# Patient Record
Sex: Female | Born: 1990 | Race: White | Hispanic: No | Marital: Single | Smoking: Never smoker
Health system: Southern US, Community
[De-identification: ages and names within clinical notes are randomized; demographics above are authoritative.]

## PROBLEM LIST (undated history)

## (undated) DIAGNOSIS — G43909 Migraine, unspecified, not intractable, without status migrainosus: Secondary | ICD-10-CM

## (undated) DIAGNOSIS — G40909 Epilepsy, unspecified, not intractable, without status epilepticus: Secondary | ICD-10-CM

## (undated) DIAGNOSIS — J45909 Unspecified asthma, uncomplicated: Secondary | ICD-10-CM

## (undated) DIAGNOSIS — F419 Anxiety disorder, unspecified: Secondary | ICD-10-CM

## (undated) DIAGNOSIS — F32A Depression, unspecified: Secondary | ICD-10-CM

## (undated) HISTORY — PX: COLONOSCOPY: SHX174

---

## 2019-05-27 ENCOUNTER — Other Ambulatory Visit: Payer: Self-pay | Admitting: Gastroenterology

## 2019-05-27 DIAGNOSIS — R1084 Generalized abdominal pain: Secondary | ICD-10-CM

## 2019-06-04 ENCOUNTER — Ambulatory Visit: Payer: Medicaid Other

## 2019-06-25 ENCOUNTER — Ambulatory Visit
Admission: RE | Admit: 2019-06-25 | Discharge: 2019-06-25 | Disposition: A | Discharge status: Home / Self Care | Payer: Medicaid Other | Source: Ambulatory Visit | Attending: Gastroenterology | Admitting: Gastroenterology

## 2019-06-25 ENCOUNTER — Other Ambulatory Visit: Payer: Self-pay

## 2019-06-25 DIAGNOSIS — R1084 Generalized abdominal pain: Secondary | ICD-10-CM | POA: Insufficient documentation

## 2021-08-20 ENCOUNTER — Ambulatory Visit
Admission: EM | Admit: 2021-08-20 | Discharge: 2021-08-20 | Disposition: A | Discharge status: Home / Self Care | Payer: Medicaid Other | Attending: Physician Assistant | Admitting: Physician Assistant

## 2021-08-20 ENCOUNTER — Ambulatory Visit (INDEPENDENT_AMBULATORY_CARE_PROVIDER_SITE_OTHER): Payer: Medicaid Other

## 2021-08-20 ENCOUNTER — Other Ambulatory Visit: Payer: Self-pay

## 2021-08-20 DIAGNOSIS — R059 Cough, unspecified: Secondary | ICD-10-CM | POA: Diagnosis not present

## 2021-08-20 DIAGNOSIS — R0781 Pleurodynia: Secondary | ICD-10-CM | POA: Diagnosis not present

## 2021-08-20 DIAGNOSIS — R051 Acute cough: Secondary | ICD-10-CM | POA: Insufficient documentation

## 2021-08-20 DIAGNOSIS — J0300 Acute streptococcal tonsillitis, unspecified: Secondary | ICD-10-CM | POA: Diagnosis not present

## 2021-08-20 LAB — GROUP A STREP BY PCR: Group A Strep by PCR: DETECTED — AB

## 2021-08-20 MED ORDER — PROMETHAZINE-DM 6.25-15 MG/5ML PO SYRP: 5.0000 mL | ORAL_SOLUTION | Freq: Four times a day (QID) | ORAL | 0 refills | Status: DC | PRN

## 2021-08-20 MED ORDER — CEPHALEXIN 500 MG PO CAPS: 500.0000 mg | ORAL_CAPSULE | Freq: Two times a day (BID) | ORAL | 0 refills | Status: AC

## 2021-08-20 MED ORDER — PREDNISONE 20 MG PO TABS: 40.0000 mg | ORAL_TABLET | Freq: Every day | ORAL | 0 refills | Status: AC

## 2021-08-20 NOTE — ED Provider Notes (Signed)
[facility]    CSN: [id number] Arrival date & time: 08/20/21  1020      History   Chief Complaint Chief Complaint  Patient presents with   Flank Pain   Sore Throat   Cough    HPI Kimberly Villanueva is a 31 y.o. female presenting for 1 month history of cough and congestion.  She says she was treated at the beginning of February with cefdinir and symptoms improved but did not totally resolve.  Patient says over the past 3 days or so her cough is gotten worse and she has noticed that her tonsils are enlarged, throat is sore and she has exudates on her tonsils with bleeding tonsils at times.  Also reports increased pain under her ribs when she coughs.  States she was given naproxen which initially helped the pain in the left side of her ribs but has not helped with a new pain on the right side of her ribs.  She request to be tested for strep since she hears is going around.  She has taken OTC meds without improvement in symptoms.  Denies any breathing difficulty.  No other concerns.  HPI  History reviewed. No pertinent past medical history.  There are no problems to display for this patient.   History reviewed. No pertinent surgical history.  OB History   No obstetric history on file.      Home Medications    Prior to Admission medications   Medication Sig Start Date End Date Taking? Authorizing Provider  cephALEXin (KEFLEX) 500 MG capsule Take 1 capsule (500 mg total) by mouth 2 (two) times daily for 10 days. 08/20/21 08/30/21 Yes Shirlee Latch, PA-C  drospirenone-ethinyl estradiol (YAZ) 3-0.02 MG tablet Take 1 tablet by mouth daily. 10/27/20  Yes [provider]  fexofenadine (ALLEGRA) 180 MG tablet Take by mouth.   Yes [provider]  fluticasone (FLONASE) 50 MCG/ACT nasal spray Place into the nose.   Yes [provider]  LORazepam (ATIVAN) 0.5 MG tablet  08/12/10  Yes [provider]  montelukast (SINGULAIR) 10 MG tablet Take by  mouth. 08/17/17  Yes [provider]  naproxen (NAPROSYN) 500 MG tablet Take 500 mg by mouth 2 (two) times daily. 08/11/21  Yes [provider]  predniSONE (DELTASONE) 20 MG tablet Take 2 tablets (40 mg total) by mouth daily for 5 days. 08/20/21 08/25/21 Yes Shirlee Latch, PA-C  promethazine-dextromethorphan (PROMETHAZINE-DM) 6.25-15 MG/5ML syrup Take 5 mLs by mouth 4 (four) times daily as needed for cough. 08/20/21  Yes Eusebio Friendly B, PA-C  sertraline (ZOLOFT) 100 MG tablet Take by mouth.   Yes [provider]  topiramate (TOPAMAX) 200 MG tablet Take 1 tablet by mouth 2 (two) times daily. 08/23/17 09/14/21 Yes [provider]  zolmitriptan (ZOMIG-ZMT) 5 MG disintegrating tablet Take 5 mg by mouth as needed. 05/07/21  Yes [provider]    Family History History reviewed. No pertinent family history.  Social History Social History   Tobacco Use   Smoking status: Never   Smokeless tobacco: Never  Vaping Use   Vaping Use: Never used  Substance Use Topics   Alcohol use: Never   Drug use: Never     Allergies   Lamotrigine, Levetiracetam, Topiramate, Amoxicillin, and Codeine   Review of Systems Review of Systems  Constitutional:  Positive for appetite change and fatigue. Negative for chills, diaphoresis and fever.  HENT:  Positive for congestion, rhinorrhea and sore throat. Negative for ear  pain, sinus pressure and sinus pain.   Respiratory:  Positive for cough. Negative for shortness of breath.   Cardiovascular:  Positive for chest pain.  Gastrointestinal:  Negative for abdominal pain, nausea and vomiting.  Musculoskeletal:  Positive for myalgias. Negative for arthralgias.  Skin:  Negative for rash.  Neurological:  Positive for headaches. Negative for weakness.  Hematological:  Negative for adenopathy.    Physical Exam Triage Vital Signs ED Triage Vitals  Enc Vitals Group     BP 08/20/21 1045 101/66     Pulse Rate 08/20/21 1045 96      Resp 08/20/21 1045 18     Temp 08/20/21 1045 99.1 F (37.3 C)     Temp Source 08/20/21 1045 Oral     SpO2 08/20/21 1045 99 %     Weight 08/20/21 1042 160 lb (72.6 kg)     Height 08/20/21 1042 5\' 4"  (1.626 m)     Head Circumference --      Peak Flow --      Pain Score 08/20/21 1042 5     Pain Loc --      Pain Edu? --      Excl. in GC? --    No data found.  Updated Vital Signs BP 101/66 (BP Location: Left Arm)    Pulse 96    Temp 99.1 F (37.3 C) (Oral)    Resp 18    Ht 5\' 4"  (1.626 m)    Wt 160 lb (72.6 kg)    LMP 07/25/2021    SpO2 99%    BMI 27.46 kg/m      Physical Exam Vitals and nursing note reviewed.  Constitutional:      General: She is not in acute distress.    Appearance: Normal appearance. She is not ill-appearing or toxic-appearing.  HENT:     Head: Normocephalic and atraumatic.     Nose: Congestion present.     Mouth/Throat:     Mouth: Mucous membranes are moist.     Pharynx: Oropharynx is clear. Posterior oropharyngeal erythema present.     Tonsils: Tonsillar exudate (thick white exudates bilateral tonsils) present. 1+ on the right. 1+ on the left.  Eyes:     General: No scleral icterus.       Right eye: No discharge.        Left eye: No discharge.     Conjunctiva/sclera: Conjunctivae normal.  Cardiovascular:     Rate and Rhythm: Normal rate and regular rhythm.     Heart sounds: Normal heart sounds.  Pulmonary:     Effort: Pulmonary effort is normal. No respiratory distress.     Breath sounds: Normal breath sounds.  Musculoskeletal:     Cervical back: Neck supple.  Lymphadenopathy:     Cervical: Cervical adenopathy present.  Skin:    General: Skin is dry.  Neurological:     General: No focal deficit present.     Mental Status: She is alert. Mental status is at baseline.     Motor: No weakness.     Gait: Gait normal.  Psychiatric:        Mood and Affect: Mood normal.        Behavior: Behavior normal.        Thought Content: Thought content normal.      UC Treatments / Results  Labs (all labs ordered are listed, but only abnormal results are displayed) Labs Reviewed  GROUP A STREP BY PCR - Abnormal; Notable for the following  components:      Result Value   Group A Strep by PCR DETECTED (*)    All other components within normal limits    EKG   Radiology DG Chest 2 View  Result Date: 08/20/2021 CLINICAL DATA:  cough x 1 month EXAM: CHEST - 2 VIEW COMPARISON:  None. FINDINGS: The cardiomediastinal silhouette is normal in contour. No pleural effusion. No pneumothorax. No acute pleuroparenchymal abnormality. Visualized abdomen is unremarkable. No acute osseous abnormality noted. IMPRESSION: No acute cardiopulmonary abnormality. Electronically Signed   By: Meda Klinefelter M.D.   On: 08/20/2021 11:09    Procedures Procedures (including critical care time)  Medications Ordered in UC Medications - No data to display  Initial Impression / Assessment and Plan / UC Course  I have reviewed the triage vital signs and the nursing notes.  Pertinent labs & imaging results that were available during my care of the patient were reviewed by me and considered in my medical decision making (see chart for details).  31 year old female presenting for 1 month history of cough and congestion with worsening symptoms over the past couple of days and new onset severe sore throat with tonsillar exudates.  Vitals are normal and stable.  Patient afebrile.  She is ill-appearing but nontoxic.  On exam she has 1+ bilateral enlarged tonsils with thick whitish exudates.  Nasal congestion.  Chest clear auscultation.  PCR strep is positive.  Chest x-ray ordered given her persistent symptoms and complaint of right-sided rib pain.  X-ray is normal.  Discussed with patient.  Treating patient with Keflex.  Has allergy to penicillin/amoxicillin but cannot take cephalosporins.  Also sent Promethazine DM to pharmacy.  Prednisone to help with tonsillar swelling as  well as hopefully the inflammation of her ribs likely due to coughing/possibly costochondritis.  Encouraged rest and fluids.  Reviewed return and ER precautions.   Final Clinical Impressions(s) / UC Diagnoses   Final diagnoses:  Strep tonsillitis  Acute cough  Rib pain     Discharge Instructions      -You have strep.  Have sent antibiotics to the pharmacy. - Your chest x-ray was normal.  No evidence of pneumonia.  I think your pain is due to the cough.  I sent cough medication to the pharmacy as well as prednisone which is a stronger anti-inflammatory medication. - Increase rest and fluids. - You should be feeling better over the next several days.   ED Prescriptions     Medication Sig Dispense Auth. Provider   cephALEXin (KEFLEX) 500 MG capsule Take 1 capsule (500 mg total) by mouth 2 (two) times daily for 10 days. 20 capsule Eusebio Friendly B, PA-C   predniSONE (DELTASONE) 20 MG tablet Take 2 tablets (40 mg total) by mouth daily for 5 days. 10 tablet Shirlee Latch, PA-C   promethazine-dextromethorphan (PROMETHAZINE-DM) 6.25-15 MG/5ML syrup Take 5 mLs by mouth 4 (four) times daily as needed for cough. 118 mL Shirlee Latch, PA-C      PDMP not reviewed this encounter.   Shirlee Latch, PA-C 08/20/21 1145

## 2021-08-20 NOTE — ED Triage Notes (Signed)
Pt states that she has been sick for 1 month with sinusitis.  ? ?Pt c/o Sore throat, bleeding in her throat, cough, x3day ? ?Pt states that her sides have been hurting under her ribs. Pt states that she was given naproxen for left side pain but it is not helping the right side pain.  ? ?Pt wants to be tested for strep throat.  ?

## 2021-08-20 NOTE — Discharge Instructions (Addendum)
-  You have strep.  Have sent antibiotics to the pharmacy. ?- Your chest x-ray was normal.  No evidence of pneumonia.  I think your pain is due to the cough.  I sent cough medication to the pharmacy as well as prednisone which is a stronger anti-inflammatory medication. ?- Increase rest and fluids. ?- You should be feeling better over the next several days. ?

## 2021-10-30 IMAGING — US US ABDOMEN LIMITED
1 series · 14 of 25 positions shown · non-contrast
Comparison: None.

CLINICAL DATA: 28-year-old female with generalized abdominal pain
intermittently for 6-7 months.

EXAM:
ULTRASOUND ABDOMEN LIMITED RIGHT UPPER QUADRANT

[Series 1: us abdomen limited · 0.23mm/px · 14 of 66 slices shown]
[im 1/66]
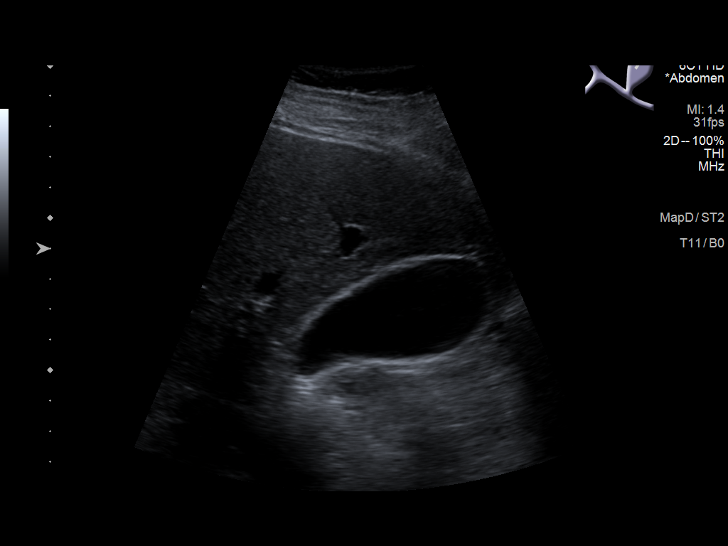
[im 6/66]
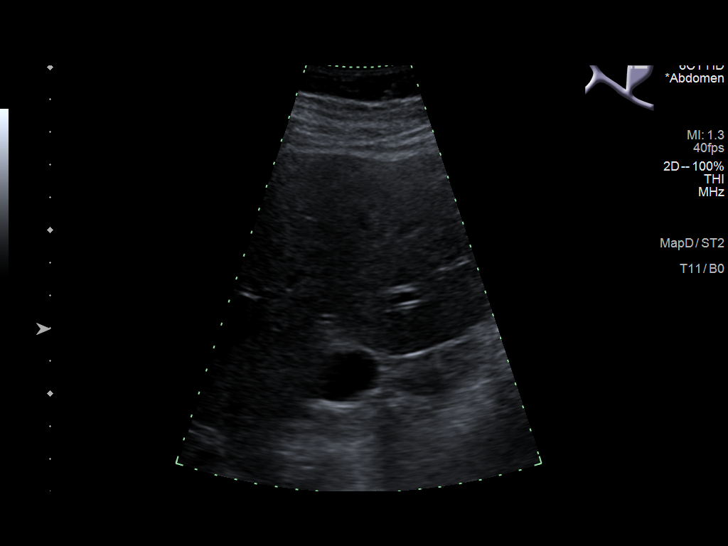
[im 11/66]
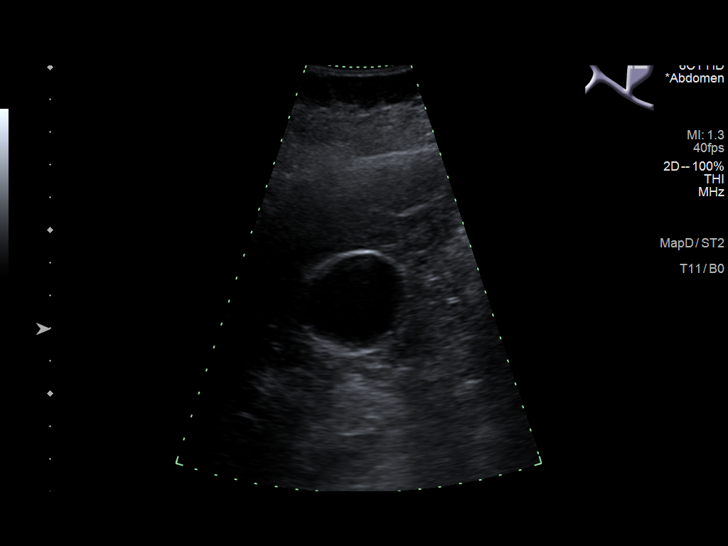
[im 17/66]
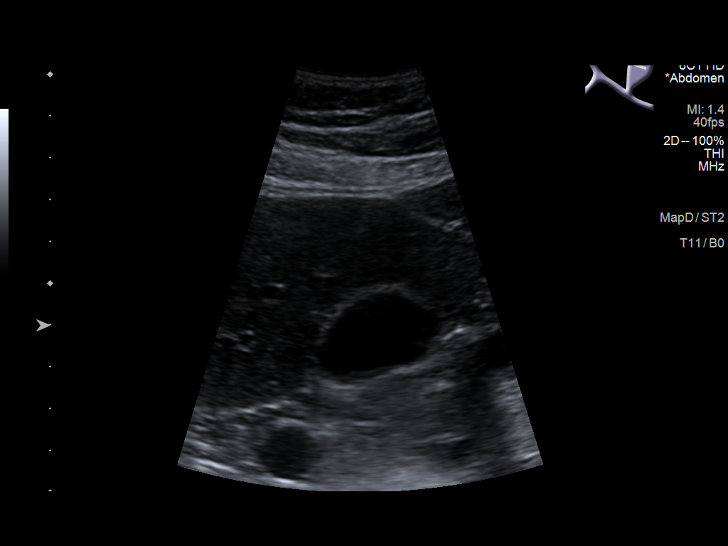
[im 22/66]
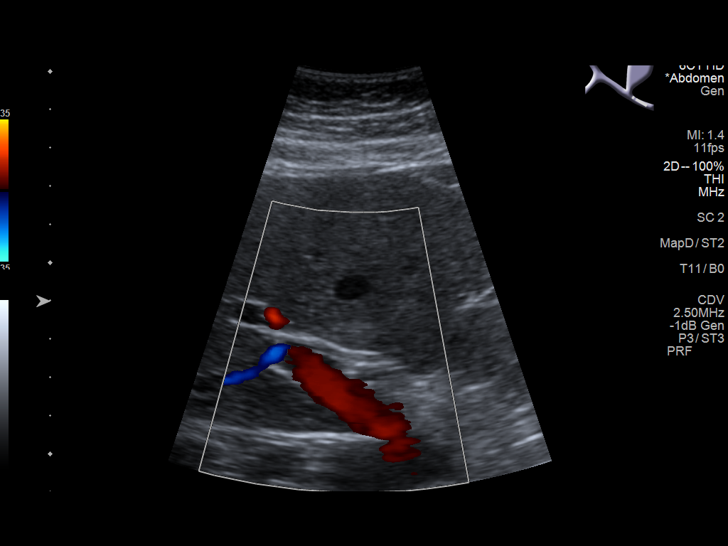
[im 25/66]
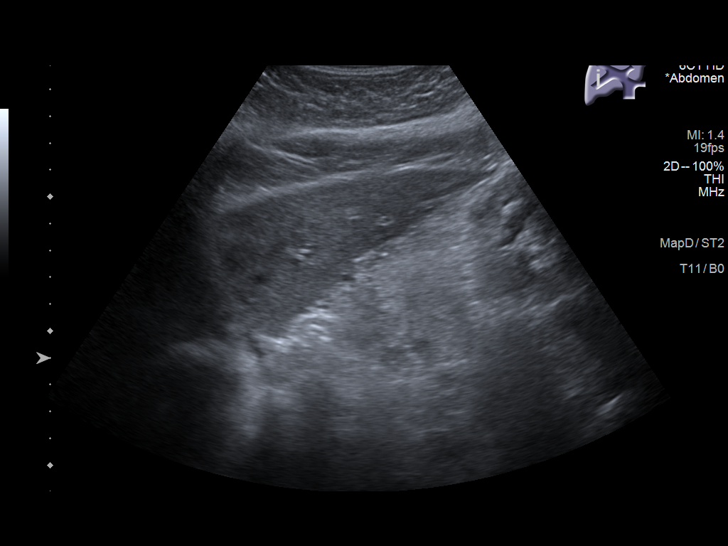
[im 30/66]
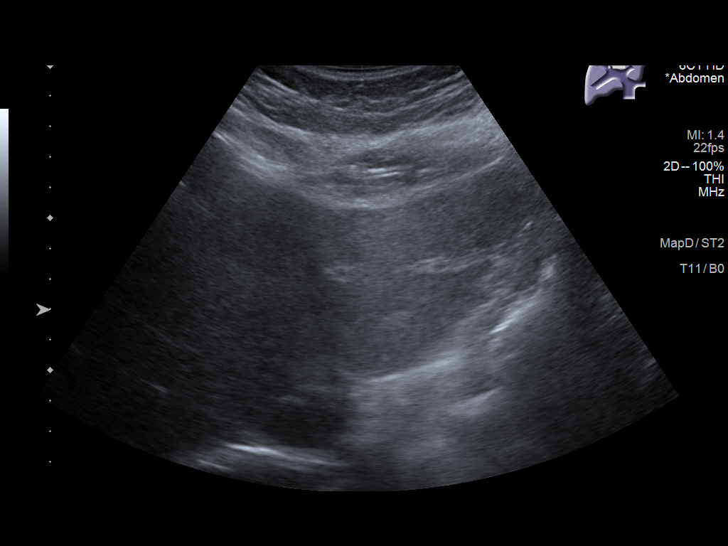
[im 36/66]
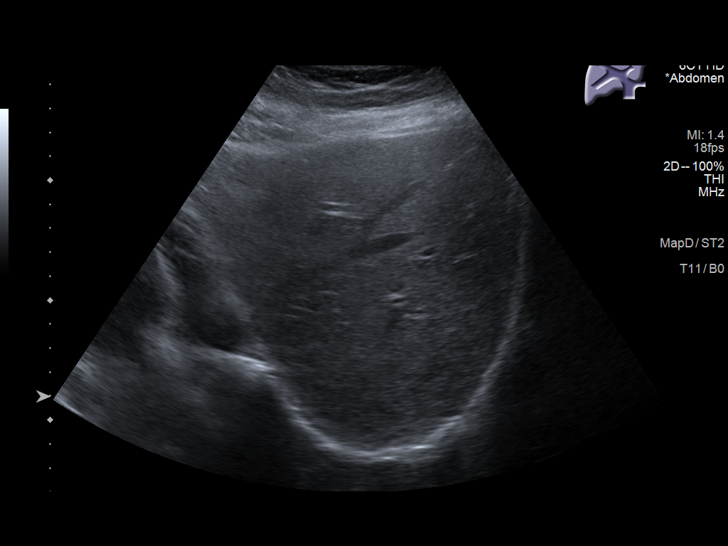
[im 41/66]
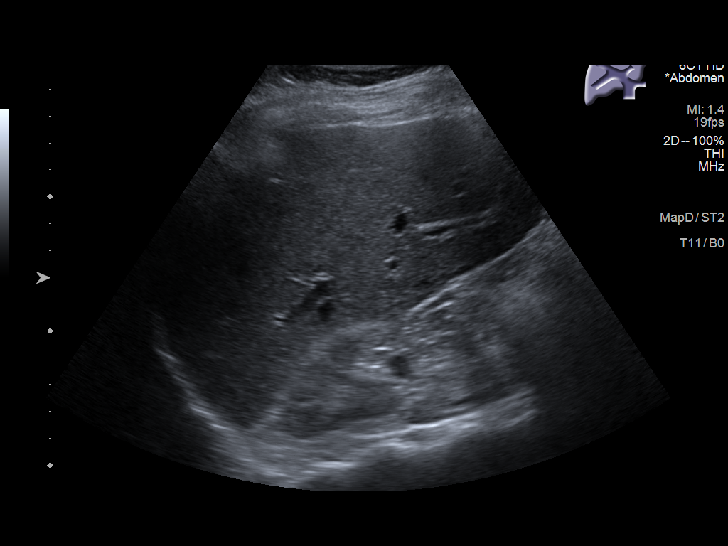
[im 44/66]
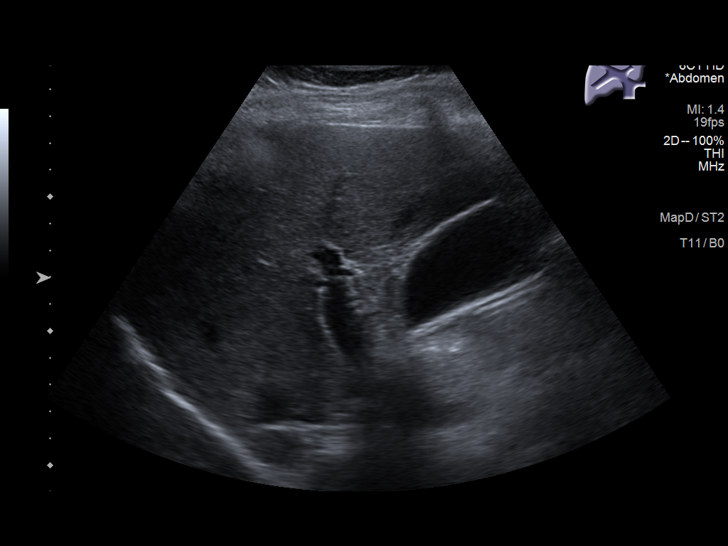
[im 49/66]
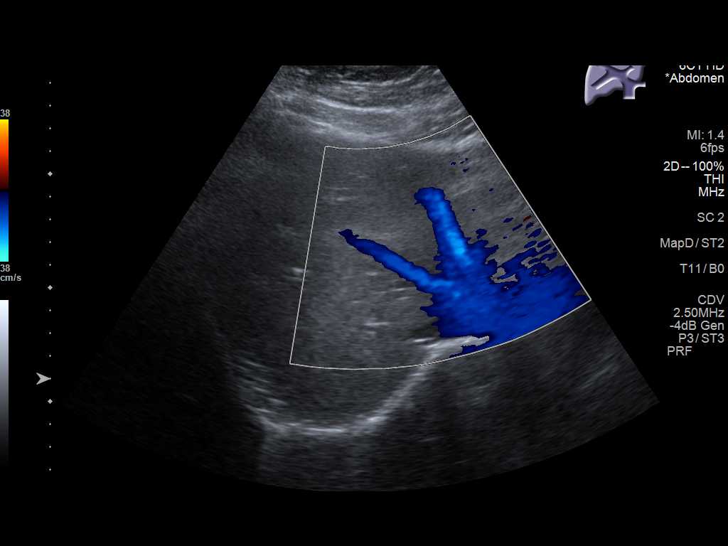
[im 55/66]
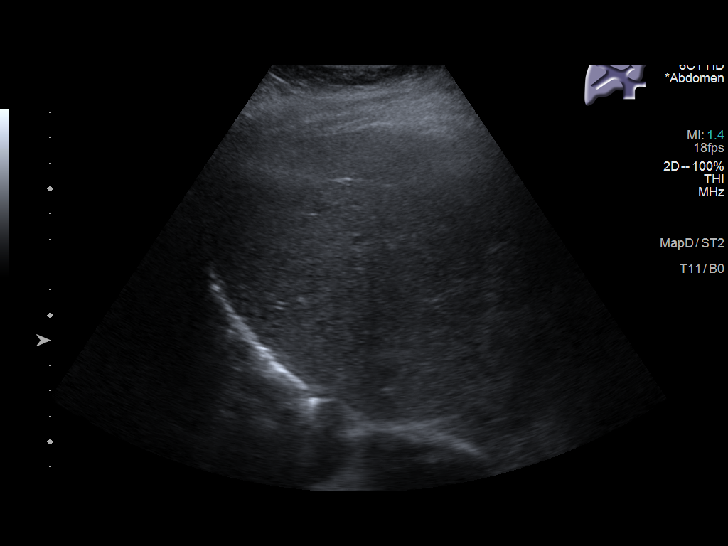
[im 60/66]
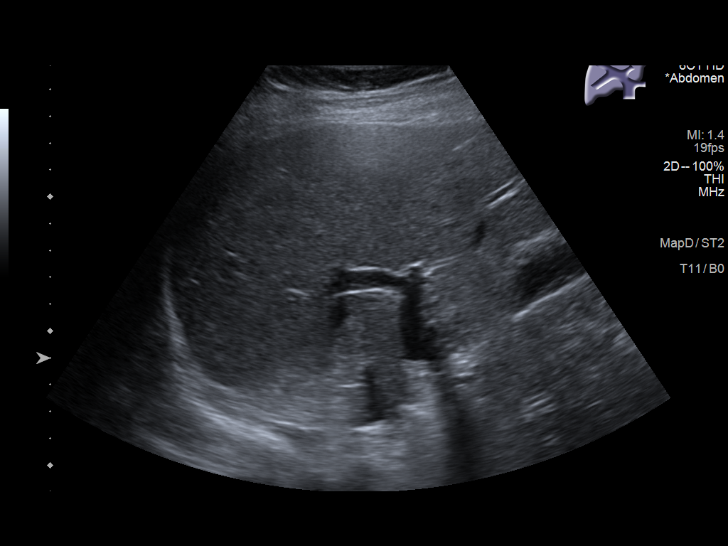
[im 66/66]
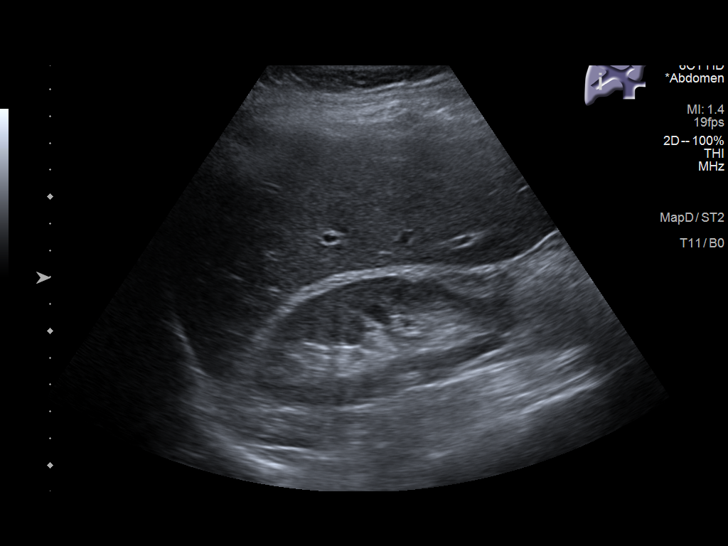

[14 of 25 positions shown; findings below may reference images not displayed]

FINDINGS: Gallbladder:

No gallstones or wall thickening visualized. No sonographic Murphy
sign noted by sonographer.

Common bile duct:

Diameter: 2 millimeters, normal.

Liver:

No focal lesion identified. Within normal limits in parenchymal
echogenicity. Portal vein is patent on color Doppler imaging with
normal direction of blood flow towards the liver.

Other: Negative visible right kidney.
IMPRESSION: Normal right upper quadrant ultrasound.

## 2023-03-11 DIAGNOSIS — N200 Calculus of kidney: Secondary | ICD-10-CM | POA: Diagnosis not present

## 2023-03-11 DIAGNOSIS — R109 Unspecified abdominal pain: Secondary | ICD-10-CM | POA: Diagnosis present

## 2023-03-11 DIAGNOSIS — E876 Hypokalemia: Secondary | ICD-10-CM | POA: Insufficient documentation

## 2023-03-11 NOTE — ED Triage Notes (Addendum)
Pt arrives via POV with CC of sharp L flank pain and spotting. Pt has had intermittent moderate pain for the last few days - sudden sharp pain today. Dx with cysts on ovaries and cervix via ultrasound approx 12 days ago (02/28/2023). Pt states told to come into ER if pain worsened. Pt appears to be in NAD at this time.

## 2023-03-12 ENCOUNTER — Emergency Department
Admission: EM | Admit: 2023-03-12 | Discharge: 2023-03-12 | Disposition: A | Discharge status: Home / Self Care | Payer: Medicaid Other | Attending: Emergency Medicine | Admitting: Emergency Medicine

## 2023-03-12 ENCOUNTER — Other Ambulatory Visit: Payer: Self-pay

## 2023-03-12 ENCOUNTER — Emergency Department: Payer: Medicaid Other

## 2023-03-12 DIAGNOSIS — N200 Calculus of kidney: Secondary | ICD-10-CM

## 2023-03-12 DIAGNOSIS — E876 Hypokalemia: Secondary | ICD-10-CM

## 2023-03-12 DIAGNOSIS — R109 Unspecified abdominal pain: Secondary | ICD-10-CM

## 2023-03-12 HISTORY — DX: Migraine, unspecified, not intractable, without status migrainosus: G43.909

## 2023-03-12 HISTORY — DX: Depression, unspecified: F32.A

## 2023-03-12 HISTORY — DX: Epilepsy, unspecified, not intractable, without status epilepticus: G40.909

## 2023-03-12 HISTORY — DX: Anxiety disorder, unspecified: F41.9

## 2023-03-12 LAB — URINALYSIS, ROUTINE W REFLEX MICROSCOPIC
Bilirubin Urine: NEGATIVE
Glucose, UA: NEGATIVE mg/dL
Hgb urine dipstick: NEGATIVE
Ketones, ur: NEGATIVE mg/dL
Leukocytes,Ua: NEGATIVE
Nitrite: NEGATIVE
Protein, ur: NEGATIVE mg/dL
Specific Gravity, Urine: 1.013 (ref 1.005–1.030)
pH: 6 (ref 5.0–8.0)

## 2023-03-12 LAB — CBC WITH DIFFERENTIAL/PLATELET
Abs Immature Granulocytes: 0.05 10*3/uL (ref 0.00–0.07)
Basophils Absolute: 0.1 10*3/uL (ref 0.0–0.1)
Basophils Relative: 1 %
Eosinophils Absolute: 0.6 10*3/uL — ABNORMAL HIGH (ref 0.0–0.5)
Eosinophils Relative: 5 %
HCT: 42.7 % (ref 36.0–46.0)
Hemoglobin: 14.7 g/dL (ref 12.0–15.0)
Immature Granulocytes: 0 %
Lymphocytes Relative: 36 %
Lymphs Abs: 4.8 10*3/uL — ABNORMAL HIGH (ref 0.7–4.0)
MCH: 31.3 pg (ref 26.0–34.0)
MCHC: 34.4 g/dL (ref 30.0–36.0)
MCV: 90.9 fL (ref 80.0–100.0)
Monocytes Absolute: 1.1 10*3/uL — ABNORMAL HIGH (ref 0.1–1.0)
Monocytes Relative: 8 %
Neutro Abs: 6.6 10*3/uL (ref 1.7–7.7)
Neutrophils Relative %: 50 %
Platelets: 327 10*3/uL (ref 150–400)
RBC: 4.7 MIL/uL (ref 3.87–5.11)
RDW: 11.9 % (ref 11.5–15.5)
WBC: 13.3 10*3/uL — ABNORMAL HIGH (ref 4.0–10.5)
nRBC: 0 % (ref 0.0–0.2)

## 2023-03-12 LAB — COMPREHENSIVE METABOLIC PANEL
ALT: 30 U/L (ref 0–44)
AST: 22 U/L (ref 15–41)
Albumin: 3.7 g/dL (ref 3.5–5.0)
Alkaline Phosphatase: 60 U/L (ref 38–126)
Anion gap: 11 (ref 5–15)
BUN: 17 mg/dL (ref 6–20)
CO2: 22 mmol/L (ref 22–32)
Calcium: 9 mg/dL (ref 8.9–10.3)
Chloride: 105 mmol/L (ref 98–111)
Creatinine, Ser: 0.83 mg/dL (ref 0.44–1.00)
GFR, Estimated: >60 mL/min (ref >60)
Glucose, Bld: 103 mg/dL — ABNORMAL HIGH (ref 70–99)
Potassium: 3 mmol/L — ABNORMAL LOW (ref 3.5–5.1)
Sodium: 138 mmol/L (ref 135–145)
Total Bilirubin: 0.5 mg/dL (ref 0.3–1.2)
Total Protein: 7 g/dL (ref 6.5–8.1)

## 2023-03-12 LAB — POC URINE PREG, ED: Preg Test, Ur: NEGATIVE

## 2023-03-12 MED ORDER — ONDANSETRON 4 MG PO TBDP: 4.0000 mg | ORAL_TABLET | Freq: Three times a day (TID) | ORAL | 0 refills | Status: DC | PRN

## 2023-03-12 MED ORDER — SODIUM CHLORIDE 0.9 % IV BOLUS
1000.0000 mL | Freq: Once | INTRAVENOUS | Status: AC
  Administered 2023-03-12: 1000 mL via INTRAVENOUS

## 2023-03-12 MED ORDER — KETOROLAC TROMETHAMINE 30 MG/ML IJ SOLN
15.0000 mg | Freq: Once | INTRAMUSCULAR | Status: AC
  Administered 2023-03-12: 15 mg via INTRAVENOUS
  Filled 2023-03-12: qty 1

## 2023-03-12 MED ORDER — HYDROCODONE-ACETAMINOPHEN 5-325 MG PO TABS: 1.0000 | ORAL_TABLET | Freq: Four times a day (QID) | ORAL | 0 refills | Status: DC | PRN

## 2023-03-12 MED ORDER — ONDANSETRON HCL 4 MG/2ML IJ SOLN
4.0000 mg | Freq: Once | INTRAMUSCULAR | Status: AC
  Administered 2023-03-12: 4 mg via INTRAVENOUS
  Filled 2023-03-12: qty 2

## 2023-03-12 NOTE — Discharge Instructions (Signed)
You may take Ibuprofen as needed for pain, Norco as needed for more severe pain.  You may take Zofran as needed for nausea or vomiting.  Drink plenty of bottled or filtered water daily.  Return to the ER for worsening symptoms, persistent vomiting, difficulty breathing or other concerns.

## 2023-03-12 NOTE — ED Provider Notes (Signed)
[facility] Provider Note    Event Date/Time   First MD Initiated Contact with Patient 03/12/23 [phone]     (approximate)   History   Flank Pain   HPI  Kimberly Villanueva is a 32 y.o. female who presents to the ED from home with a chief complaint of left flank pain x 1 week, intermittent.  Sudden sharp increase in pain around 10 PM last night associated with nausea.  Almost 2 weeks ago she was diagnosed with ovarian cysts via ultrasound by her GYN.  Denies associated fever/chills, chest pain, shortness of breath, vomiting or hematuria.     Past Medical History   Past Medical History:  Diagnosis Date   Anxiety    Depression    Epilepsy (HCC)    Migraine      Active Problem List  There are no problems to display for this patient.    Past Surgical History  History reviewed. No pertinent surgical history.   Home Medications   Prior to Admission medications   Medication Sig Start Date End Date Taking? Authorizing Provider  HYDROcodone-acetaminophen (NORCO) 5-325 MG tablet Take 1 tablet by mouth every 6 (six) hours as needed for moderate pain. 03/12/23  Yes Irean Hong, MD  ondansetron (ZOFRAN-ODT) 4 MG disintegrating tablet Take 1 tablet (4 mg total) by mouth every 8 (eight) hours as needed for nausea or vomiting. 03/12/23  Yes Irean Hong, MD  drospirenone-ethinyl estradiol (YAZ) 3-0.02 MG tablet Take 1 tablet by mouth daily. 10/27/20   [provider]  fexofenadine (ALLEGRA) 180 MG tablet Take by mouth.    [provider]  fluticasone (FLONASE) 50 MCG/ACT nasal spray Place into the nose.    [provider]  LORazepam (ATIVAN) 0.5 MG tablet  08/12/10   [provider]  montelukast (SINGULAIR) 10 MG tablet Take by mouth. 08/17/17   [provider]  naproxen (NAPROSYN) 500 MG tablet Take 500 mg by mouth 2 (two) times daily. 08/11/21   [provider]  promethazine-dextromethorphan (PROMETHAZINE-DM)  6.25-15 MG/5ML syrup Take 5 mLs by mouth 4 (four) times daily as needed for cough. 08/20/21   Eusebio Friendly B, PA-C  sertraline (ZOLOFT) 100 MG tablet Take by mouth.    [provider]  topiramate (TOPAMAX) 200 MG tablet Take 1 tablet by mouth 2 (two) times daily. 08/23/17 09/14/21  [provider]  zolmitriptan (ZOMIG-ZMT) 5 MG disintegrating tablet Take 5 mg by mouth as needed. 05/07/21   [provider]     Allergies  Lamotrigine, Levetiracetam, Topiramate, Amoxicillin, and Codeine   Family History  History reviewed. No pertinent family history.   Physical Exam  Triage Vital Signs: ED Triage Vitals  Encounter Vitals Group     BP 03/12/23 0001 106/81     Systolic BP Percentile --      Diastolic BP Percentile --      Pulse Rate 03/12/23 0001 78     Resp 03/12/23 0001 15     Temp 03/12/23 0001 98 F (36.7 C)     Temp Source 03/12/23 0001 Oral     SpO2 03/12/23 0001 98 %     Weight 03/12/23 0002 155 lb (70.3 kg)     Height 03/12/23 0002 5\' 5"  (1.651 m)     Head Circumference --      Peak Flow --      Pain Score 03/12/23 0001 7     Pain Loc --  Pain Education --      Exclude from Growth Chart --     Updated Vital Signs: BP 94/66   Pulse 66   Temp 98 F (36.7 C) (Oral)   Resp 14   Ht 5\' 5"  (1.651 m)   Wt 70.3 kg   SpO2 100%   BMI 25.79 kg/m    General: Awake, no distress.  CV:  RRR.  Good peripheral perfusion.  Resp:  Normal effort.  CTAB. Abd:  Slight left lower quadrant and flank tenderness to palpation without rebound or guarding.  No distention.  Other:  No truncal vesicles.   ED Results / Procedures / Treatments  Labs (all labs ordered are listed, but only abnormal results are displayed) Labs Reviewed  CBC WITH DIFFERENTIAL/PLATELET - Abnormal; Notable for the following components:      Result Value   WBC 13.3 (*)    Lymphs Abs 4.8 (*)    Monocytes Absolute 1.1 (*)    Eosinophils Absolute 0.6 (*)    All other components  within normal limits  COMPREHENSIVE METABOLIC PANEL - Abnormal; Notable for the following components:   Potassium 3.0 (*)    Glucose, Bld 103 (*)    All other components within normal limits  URINALYSIS, ROUTINE W REFLEX MICROSCOPIC - Abnormal; Notable for the following components:   Color, Urine YELLOW (*)    APPearance CLOUDY (*)    All other components within normal limits  POC URINE PREG, ED     EKG  None   RADIOLOGY I have independently visualized and interpreted patient's imaging studies as well as noted the radiology interpretation:  Pelvic ultrasound: Unremarkable  CT renal stone study: Renal stones bilaterally  Official radiology report(s): CT Renal Stone Study  Result Date: 03/12/2023 CLINICAL DATA:  Abdominal/flank pain with stone suspected. Sharp left flank pain with spotting EXAM: CT ABDOMEN AND PELVIS WITHOUT CONTRAST TECHNIQUE: Multidetector CT imaging of the abdomen and pelvis was performed following the standard protocol without IV contrast. RADIATION DOSE REDUCTION: This exam was performed according to the departmental dose-optimization program which includes automated exposure control, adjustment of the mA and/or kV according to patient size and/or use of iterative reconstruction technique. COMPARISON:  None Available. FINDINGS: Lower chest:  No contributory findings. Hepatobiliary: No focal liver abnormality.No evidence of biliary obstruction or stone. Pancreas: Unremarkable. Spleen: Unremarkable. Adrenals/Urinary Tract: Negative adrenals. No hydronephrosis or ureteral stone. Four left renal calculi measuring up to 5 mm at the lower pole. Punctate right lower pole calculus. Unremarkable bladder. Stomach/Bowel:  No obstruction. No appendicitis. Vascular/Lymphatic: No acute vascular abnormality. No mass or adenopathy. Reproductive:No pathologic findings. Other: No ascites or pneumoperitoneum. Musculoskeletal: No acute abnormalities. IMPRESSION: 1. No acute finding.  No  hydronephrosis or ureteral calculus. 2. Left more than right nephrolithiasis. Electronically Signed   By: Tiburcio Pea M.D.   On: 03/12/2023 05:00   US PELVIC COMPLETE W TRANSVAGINAL AND TORSION R/O  Result Date: 03/12/2023 CLINICAL DATA:  Pelvic pain. EXAM: TRANSABDOMINAL AND TRANSVAGINAL ULTRASOUND OF PELVIS DOPPLER ULTRASOUND OF OVARIES TECHNIQUE: Both transabdominal and transvaginal ultrasound examinations of the pelvis were performed. Transabdominal technique was performed for global imaging of the pelvis including uterus, ovaries, adnexal regions, and pelvic cul-de-sac. It was necessary to proceed with endovaginal exam following the transabdominal exam to visualize the uterus, endometrium, bilateral ovaries and bilateral adnexa. Color and duplex Doppler ultrasound was utilized to evaluate blood flow to the ovaries. COMPARISON:  None Available. FINDINGS: Uterus Measurements: 7.0 cm x 3.8 cm x 3.9  cm = volume: 54.0 mL. No fibroids or other mass visualized. Endometrium Thickness: 4.0 mm.  No focal abnormality visualized. Right ovary Measurements: 3.0 cm x 1.3 cm x 1.7 cm = volume: 3.6 mL. Normal appearance/no adnexal mass. Left ovary Measurements: 3.0 cm x 1.8 cm x 2.1 cm = volume: 5.8 mL. Normal appearance/no adnexal mass. Pulsed Doppler evaluation of both ovaries demonstrates normal low-resistance arterial and venous waveforms. Other findings No abnormal free fluid. IMPRESSION: Unremarkable pelvic ultrasound. Electronically Signed   By: Aram Candela M.D.   On: 03/12/2023 01:09     PROCEDURES:  Critical Care performed: No  Procedures   MEDICATIONS ORDERED IN ED: Medications  sodium chloride 0.9 % bolus 1,000 mL (1,000 mLs Intravenous New Bag/Given 03/12/23 0501)  ondansetron (ZOFRAN) injection 4 mg (4 mg Intravenous Given 03/12/23 0502)  ketorolac (TORADOL) 30 MG/ML injection 15 mg (15 mg Intravenous Given 03/12/23 0502)     IMPRESSION / MDM / ASSESSMENT AND PLAN / ED COURSE  I  reviewed the triage vital signs and the nursing notes.                             32 year old female presenting with left flank pain. Differential diagnosis includes, but is not limited to, ovarian cyst, ovarian torsion, acute appendicitis, diverticulitis, urinary tract infection/pyelonephritis, endometriosis, bowel obstruction, colitis, renal colic, gastroenteritis, hernia, fibroids, endometriosis, pregnancy related pain including ectopic pregnancy, etc. I personally reviewed patient's records and note her GYN office visit on 02/28/2023 for ovarian cysts.  Patient's presentation is most consistent with acute complicated illness / injury requiring diagnostic workup.  The patient is on the cardiac monitor to evaluate for evidence of arrhythmia and/or significant heart rate changes.  Laboratory results demonstrate mild leukocytosis with WBC 13, mild hypokalemia potassium 3, cloudy appearance of urine without nitrites or leukocytes.  POCT negative.  Pelvic ultrasound unremarkable.  Will administer IV fluids, Ketorolac for pain, Zofran for nausea.  Will obtain CT renal colic study and reassess.  Clinical Course as of 03/12/23 4132  [facility] Mar 12, 2023  0554 Pain improved.  Updated patient and family member of CT results demonstrating renal stones.  Will discharge home with as needed pain and nausea medicine with urology follow-up.  Strict return precautions given.  Both verbalized understanding and agree with plan of care. [JS]    Clinical Course User Index [JS] Irean Hong, MD     FINAL CLINICAL IMPRESSION(S) / ED DIAGNOSES   Final diagnoses:  Left flank pain  Hypokalemia  Kidney stone     Rx / DC Orders   ED Discharge Orders          Ordered    HYDROcodone-acetaminophen (NORCO) 5-325 MG tablet  Every 6 hours PRN        03/12/23 0556    ondansetron (ZOFRAN-ODT) 4 MG disintegrating tablet  Every 8 hours PRN        03/12/23 0556             Note:  This document was prepared  using Dragon voice recognition software and may include unintentional dictation errors.   Irean Hong, MD 03/12/23 [phone]

## 2023-04-04 ENCOUNTER — Ambulatory Visit: Payer: Medicaid Other | Admitting: Urology

## 2023-04-04 ENCOUNTER — Encounter: Payer: Self-pay | Admitting: Urology

## 2023-04-04 VITALS — BP 105/70 | HR 92 | Ht 65.0 in | Wt 156.0 lb

## 2023-04-04 DIAGNOSIS — N200 Calculus of kidney: Secondary | ICD-10-CM | POA: Diagnosis not present

## 2023-04-04 DIAGNOSIS — R109 Unspecified abdominal pain: Secondary | ICD-10-CM

## 2023-04-04 NOTE — Patient Instructions (Signed)
For musculoskeletal low back pain, I would recommend the "[employer]ill Big 3" exercises, you can find more details online, but these exercises are the bird-dog, side plank, and modified curl up.  These can help within just a few days with low back pain if it is related to musculoskeletal issues.  Would recommend trying to find an alternative to Topamax, as this can contribute to kidney stone formation.  Acute Back Pain, Adult Acute back pain is sudden and usually short-lived. It is often caused by an injury to the muscles and tissues in the back. The injury may result from: A muscle, tendon, or ligament getting overstretched or torn. Ligaments are tissues that connect bones to each other. Lifting something improperly can cause a back strain. Wear and tear (degeneration) of the spinal disks. Spinal disks are circular tissue that provide cushioning between the bones of the spine (vertebrae). Twisting motions, such as while playing sports or doing yard work. A hit to the back. Arthritis. You may have a physical exam, lab tests, and imaging tests to find the cause of your pain. Acute back pain usually goes away with rest and home care. Follow these instructions at home: Managing pain, stiffness, and swelling Take over-the-counter and prescription medicines only as told by your health care provider. Treatment may include medicines for pain and inflammation that are taken by mouth or applied to the skin, or muscle relaxants. Your health care provider may recommend applying ice during the first 24-48 hours after your pain starts. To do this: Put ice in a plastic bag. Place a towel between your skin and the bag. Leave the ice on for 20 minutes, 2-3 times a day. Remove the ice if your skin turns bright red. This is very important. If you cannot feel pain, heat, or cold, you have a greater risk of damage to the area. If directed, apply heat to the affected area as often as told by your health care provider. Use  the heat source that your health care provider recommends, such as a moist heat pack or a heating pad. Place a towel between your skin and the heat source. Leave the heat on for 20-30 minutes. Remove the heat if your skin turns bright red. This is especially important if you are unable to feel pain, heat, or cold. You have a greater risk of getting burned. Activity  Do not stay in bed. Staying in bed for more than 1-2 days can delay your recovery. Sit up and stand up straight. Avoid leaning forward when you sit or hunching over when you stand. If you work at a desk, sit close to it so you do not need to lean over. Keep your chin tucked in. Keep your neck drawn back, and keep your elbows bent at a 90-degree angle (right angle). Sit high and close to the steering wheel when you drive. Add lower back (lumbar) support to your car seat, if needed. Take short walks on even surfaces as soon as you are able. Try to increase the length of time you walk each day. Do not sit, drive, or stand in one place for more than 30 minutes at a time. Sitting or standing for long periods of time can put stress on your back. Do not drive or use heavy machinery while taking prescription pain medicine. Use proper lifting techniques. When you bend and lift, use positions that put less stress on your back: Watson your knees. Keep the load close to your body. Avoid twisting. Exercise regularly  as told by your health care provider. Exercising helps your back heal faster and helps prevent back injuries by keeping muscles strong and flexible. Work with a physical therapist to make a safe exercise program, as recommended by your health care provider. Do any exercises as told by your physical therapist. Lifestyle Maintain a healthy weight. Extra weight puts stress on your back and makes it difficult to have good posture. Avoid activities or situations that make you feel anxious or stressed. Stress and anxiety increase muscle  tension and can make back pain worse. Learn ways to manage anxiety and stress, such as through exercise. General instructions Sleep on a firm mattress in a comfortable position. Try lying on your side with your knees slightly bent. If you lie on your back, put a pillow under your knees. Keep your head and neck in a straight line with your spine (neutral position) when using electronic equipment like smartphones or pads. To do this: Raise your smartphone or pad to look at it instead of bending your head or neck to look down. Put the smartphone or pad at the level of your face while looking at the screen. Follow your treatment plan as told by your health care provider. This may include: Cognitive or behavioral therapy. Acupuncture or massage therapy. Meditation or yoga. Contact a health care provider if: You have pain that is not relieved with rest or medicine. You have increasing pain going down into your legs or buttocks. Your pain does not improve after 2 weeks. You have pain at night. You lose weight without trying. You have a fever or chills. You develop nausea or vomiting. You develop abdominal pain. Get help right away if: You develop new bowel or bladder control problems. You have unusual weakness or numbness in your arms or legs. You feel faint. These symptoms may represent a serious problem that is an emergency. Do not wait to see if the symptoms will go away. Get medical help right away. Call your local emergency services (911 in the [place] Do not drive yourself to the hospital. Summary Acute back pain is sudden and usually short-lived. Use proper lifting techniques. When you bend and lift, use positions that put less stress on your back. Take over-the-counter and prescription medicines only as told by your health care provider, and apply heat or ice as told. This information is not intended to replace advice given to you by your health care provider. Make sure you discuss any  questions you have with your health care provider. Document Revised: 08/27/2020 Document Reviewed: 08/27/2020 Elsevier Patient Education  2024 [employer].

## 2023-04-04 NOTE — Progress Notes (Signed)
   04/04/23 11:45 AM   Kimberly Villanueva 04/21/1991 [id number]  CC: Flank pain, nephrolithiasis  HPI: 32 year old female with history of epilepsy on Topamax as well as anxiety who presented to the ER on 03/12/2023 with left-sided flank pain.  She previously was diagnosed with possible ovarian cyst on ultrasound by GYN.  CT at that time showed no hydronephrosis, nonobstructing left-sided renal stones including a 5 mm left lower pole stone.  Urinalysis was benign.  Since that time she has overall done well but had some intermittent flank pain, more so on the right side.  She denies any fevers, chills, or urinary symptoms.   PMH: Past Medical History:  Diagnosis Date   Anxiety    Depression    Epilepsy (HCC)    Migraine      Family History: History reviewed. No pertinent family history.  Social History:  reports that she has never smoked. She has never used smokeless tobacco. She reports that she does not drink alcohol and does not use drugs.  Physical Exam: BP 105/70 (BP Location: Left Arm, Patient Position: Sitting, Cuff Size: Normal)   Pulse 92   Ht 5\' 5"  (1.651 m)   Wt 156 lb (70.8 kg)   BMI 25.96 kg/m    Constitutional:  Alert and oriented, No acute distress. Cardiovascular: No clubbing, cyanosis, or edema. Respiratory: Normal respiratory effort, no increased work of breathing. GI: Abdomen is soft, nontender, nondistended, no abdominal masses   Laboratory Data: Reviewed, see HPI  Pertinent Imaging: I have personally viewed and interpreted the CT scan showing a 5 mm left lower pole stone, no evidence of ureteral stone or hydronephrosis.  Assessment & Plan:   32 year old female with nonobstructing nephrolithiasis seen on recent CT, largest stone is 5 mm in the left lower pole, her symptoms are primarily right-sided over the last week.  Urinalysis is benign.  We discussed symptoms are unlikely to be related to nephrolithiasis.  We reviewed possible etiologies including  intermittent obstruction from a stone, ovarian cyst, or musculoskeletal.  I also recommended discussing with her neurologist alternatives to Topamax that would be less likely to contribute to kidney stone formation.  We also discussed considering removing the left-sided stone with shockwave lithotripsy or ureteroscopy, but low suspicion that would improve her right-sided flank pain.  We also reviewed some low back strengthening exercises including the McGill big 3 in case this is more of a musculoskeletal issue.  We discussed general stone prevention strategies including adequate hydration with goal of producing 2.5 L of urine daily, increasing citric acid intake, increasing calcium intake during high oxalate meals, minimizing animal protein, and decreasing salt intake. Information about dietary recommendations given today.   RTC 3 months symptom check, KUB   Legrand Rams, MD 04/04/2023  [facility] [address] [place], [place] [address] [phone]

## 2023-07-10 ENCOUNTER — Telehealth: Payer: Self-pay

## 2023-07-10 ENCOUNTER — Ambulatory Visit: Payer: Medicaid Other | Admitting: Urology

## 2023-07-10 ENCOUNTER — Ambulatory Visit
Admission: RE | Admit: 2023-07-10 | Discharge: 2023-07-10 | Disposition: A | Discharge status: Home / Self Care | Payer: Medicaid Other | Attending: Urology | Admitting: Urology

## 2023-07-10 ENCOUNTER — Other Ambulatory Visit
Admission: RE | Admit: 2023-07-10 | Discharge: 2023-07-10 | Disposition: A | Discharge status: Home / Self Care | Payer: Medicaid Other | Source: Home / Self Care | Attending: Urology | Admitting: Urology

## 2023-07-10 ENCOUNTER — Encounter: Payer: Self-pay | Admitting: Urology

## 2023-07-10 ENCOUNTER — Ambulatory Visit
Admission: RE | Admit: 2023-07-10 | Discharge: 2023-07-10 | Disposition: A | Discharge status: Home / Self Care | Payer: Medicaid Other | Source: Ambulatory Visit | Attending: Urology | Admitting: Urology

## 2023-07-10 VITALS — BP 96/66 | HR 88 | Ht 65.0 in | Wt 160.0 lb

## 2023-07-10 DIAGNOSIS — N200 Calculus of kidney: Secondary | ICD-10-CM

## 2023-07-10 DIAGNOSIS — R109 Unspecified abdominal pain: Secondary | ICD-10-CM | POA: Diagnosis present

## 2023-07-10 DIAGNOSIS — N3000 Acute cystitis without hematuria: Secondary | ICD-10-CM

## 2023-07-10 DIAGNOSIS — R3989 Other symptoms and signs involving the genitourinary system: Secondary | ICD-10-CM

## 2023-07-10 DIAGNOSIS — B379 Candidiasis, unspecified: Secondary | ICD-10-CM

## 2023-07-10 LAB — URINALYSIS, COMPLETE (UACMP) WITH MICROSCOPIC
Bilirubin Urine: NEGATIVE
Glucose, UA: NEGATIVE mg/dL
Hgb urine dipstick: NEGATIVE
Ketones, ur: NEGATIVE mg/dL
Nitrite: POSITIVE — AB
Protein, ur: 30 mg/dL — AB
RBC / HPF: NONE SEEN RBC/hpf (ref 0–5)
Specific Gravity, Urine: 1.025 (ref 1.005–1.030)
pH: 6 (ref 5.0–8.0)

## 2023-07-10 MED ORDER — SULFAMETHOXAZOLE-TRIMETHOPRIM 800-160 MG PO TABS
1.0000 | ORAL_TABLET | Freq: Two times a day (BID) | ORAL | 0 refills | Status: DC
Start: 2023-07-10 — End: 2023-07-15

## 2023-07-10 MED ORDER — FLUCONAZOLE 150 MG PO TABS
150.0000 mg | ORAL_TABLET | Freq: Once | ORAL | 0 refills | Status: AC
Start: 2023-07-10 — End: 2023-07-10

## 2023-07-10 NOTE — Progress Notes (Signed)
   07/10/2023 9:46 AM   Kimberly Villanueva [birth year] [id number]  Reason for visit: Follow up non-obstructing nephrolithiasis, new UTI symptoms  HPI: 34 year old female who I originally met in October 2024 when she was referred for a nonobstructing left-sided 5 mm lower pole stone and back pain.  She opted for observation at that time as her pain was primarily right-sided, and her stone was nonobstructing on CT.  Today, she reports a single episode of left-sided flank pain about a week ago that resolved spontaneously.  I personally reviewed and interpreted her KUB today that shows no change in a lower pole 5 mm stone on the left side.  We reviewed options including surveillance, shockwave lithotripsy, or ureteroscopy she opted for surveillance.  I also recommended she try to find an alternative to Topamax, and she has an appointment with her neurologist next week.  She also reports about a week of UTI symptoms with dysuria, urgency, foul-smelling urine, cloudy urine.  She denies any possibility of STD.  Urinalysis today appears grossly infected with 0-5 squamous cells, 11-20 WBC, 0 RBC, many bacteria, WBC clumps, small leukocytes, nitrite positive, yeast present..  -Recommend Bactrim DS twice daily x 5 days for UTI, as well as fluconazole for yeast in the urine -Follow-up urine culture -Continue observation for nonobstructing left lower pole stone, consider shockwave if she desires treatment(discussed at length this would be unlikely to resolve her intermittent flank pain)    Sondra Come, MD  [facility] [address], [address] [place], [place] [address] [phone]

## 2023-07-10 NOTE — Patient Instructions (Signed)
ESWL for Kidney Stones  Extracorporeal shock wave lithotripsy (ESWL) is a treatment that can help break up kidney stones that are too large to pass on their own.  This is a nonsurgical procedure that breaks up a kidney stone with shock waves. These shock waves pass through your body and focus on the kidney stone. They cause the kidney stone to break into smaller pieces (fragments) while it is still in the urinary tract. The fragments of stone can pass more easily out of your body in the pee (urine). Tell a health care provider about: Any allergies you have. All medicines you are taking, including vitamins, herbs, eye drops, creams, and over-the-counter medicines. Any problems you or family members have had with anesthetic medicines. Any bleeding problems you have. Any surgeries you've had. Any medical conditions you have. Whether you're pregnant or may be pregnant. What are the risks? Your health care provider will talk with you about risks. These may include: Infection. Bleeding from the kidney. Bruising of the kidney or skin. Scarring of the kidney. This can lead to: Increased blood pressure. Poor kidney function. Return (recurrence) of kidney stones. Damage to other structures or organs. This may include the liver, colon, spleen, or pancreas. Blockage (obstruction) of the tube that carries pee from the kidney to the bladder (ureter). Failure of the kidney stone to break into fragments. What happens before the procedure? When to stop eating and drinking Follow instructions from your health care provider about what you may eat and drink. These may include: 8 hours before your procedure Stop eating most foods. Do not eat meat, fried foods, or fatty foods. Eat only light foods, such as toast or crackers. All liquids are okay except energy drinks and alcohol. 6 hours before your procedure Stop eating. Drink only clear liquids, such as water, clear fruit juice, black coffee, plain tea,  and sports drinks. Do not drink energy drinks or alcohol. 2 hours before your procedure Stop drinking all liquids. You may be allowed to take medicines with small sips of water. If you do not follow your health care provider's instructions, your procedure may be delayed or canceled. Medicines Ask your health care provider about: Changing or stopping your regular medicines. These include any diabetes medicines or blood thinners you take. Taking medicines such as aspirin and ibuprofen. These medicines can thin your blood. Do not take them unless your health care provider tells you to. Taking over-the-counter medicines, vitamins, herbs, and supplements. Tests You may have tests, such as: Blood tests. Pee (urine) tests. Imaging tests. This may include a CT scan. Surgery safety Ask your health care provider: How your surgery site will be marked. What steps will be taken to help prevent infection. These steps may include: Washing skin with a soap that kills germs. Receiving antibiotics. General instructions If you will be going home right after the procedure, plan to have a responsible adult: Take you home from the hospital or clinic. You will not be allowed to drive. Care for you for the time you are told. What happens during the procedure?  An IV will be inserted into one of your veins. You may be given: A sedative. This helps you relax. Anesthesia. This will: Numb certain areas of your body. Make you fall asleep for surgery. A water-filled cushion may be placed behind your kidney or on your abdomen. In some cases, you may be placed in a tub of lukewarm water. Your body will be positioned in a way that makes it  easier to target the kidney stone. An X-ray or ultrasound exam will be done to locate your stone. Shock waves will be aimed at the stone. If you are awake, you may feel a tapping sensation as the shock waves pass through your body. A small mesh tube (stent) may be placed in  your ureter. This will help keep pee flowing from the kidney if the fragments of the stone have been blocking the ureter. The stent will be removed at a later time by your health care provider. The procedure may vary among health care providers and hospitals. What happens after the procedure? Your blood pressure, heart rate, breathing rate, and blood oxygen level will be monitored until you leave the hospital or clinic. You may have an X-ray after the procedure to see how many of the kidney stones were broken up. This will also show how much of the stone has passed. If there are still large fragments after treatment, you may need to have a second procedure at a later time. This information is not intended to replace advice given to you by your health care provider. Make sure you discuss any questions you have with your health care provider. Document Revised: 12/16/2022 Document Reviewed: 10/06/2021 Elsevier Patient Education  2024 [employer].

## 2023-07-10 NOTE — Telephone Encounter (Signed)
Called pt no answer. Left detailed message per DPR. Rx sent in for fluconazole and bactrim. Advised pt to call back for questions or concerns.

## 2023-07-10 NOTE — Telephone Encounter (Signed)
-----   Message from Sondra Come sent at 07/10/2023 10:43 AM EST ----- Urinalysis does show infection.  Please send in Bactrim DS twice daily x 5 days, as well as fluconazole dose for yeast in the urine  Legrand Rams, MD 07/10/2023 ]

## 2023-07-11 ENCOUNTER — Inpatient Hospital Stay
Admission: EM | Admit: 2023-07-11 | Discharge: 2023-07-14 | DRG: 871 | Disposition: A | Discharge status: Home / Self Care | Payer: Medicaid Other | Attending: Family Medicine | Admitting: Family Medicine

## 2023-07-11 ENCOUNTER — Telehealth: Payer: Self-pay | Admitting: *Deleted

## 2023-07-11 ENCOUNTER — Other Ambulatory Visit: Payer: Self-pay

## 2023-07-11 ENCOUNTER — Emergency Department: Payer: Medicaid Other

## 2023-07-11 DIAGNOSIS — N10 Acute tubulo-interstitial nephritis: Secondary | ICD-10-CM | POA: Diagnosis present

## 2023-07-11 DIAGNOSIS — J45909 Unspecified asthma, uncomplicated: Secondary | ICD-10-CM | POA: Diagnosis present

## 2023-07-11 DIAGNOSIS — A4151 Sepsis due to Escherichia coli [E. coli]: Principal | ICD-10-CM | POA: Diagnosis present

## 2023-07-11 DIAGNOSIS — R6520 Severe sepsis without septic shock: Secondary | ICD-10-CM

## 2023-07-11 DIAGNOSIS — N200 Calculus of kidney: Secondary | ICD-10-CM | POA: Diagnosis present

## 2023-07-11 DIAGNOSIS — Z1629 Resistance to other single specified antibiotic: Secondary | ICD-10-CM | POA: Diagnosis present

## 2023-07-11 DIAGNOSIS — F32A Depression, unspecified: Secondary | ICD-10-CM | POA: Diagnosis present

## 2023-07-11 DIAGNOSIS — L270 Generalized skin eruption due to drugs and medicaments taken internally: Secondary | ICD-10-CM | POA: Diagnosis present

## 2023-07-11 DIAGNOSIS — Z87442 Personal history of urinary calculi: Secondary | ICD-10-CM | POA: Diagnosis not present

## 2023-07-11 DIAGNOSIS — E872 Acidosis, unspecified: Secondary | ICD-10-CM | POA: Diagnosis present

## 2023-07-11 DIAGNOSIS — R739 Hyperglycemia, unspecified: Secondary | ICD-10-CM | POA: Diagnosis not present

## 2023-07-11 DIAGNOSIS — E876 Hypokalemia: Secondary | ICD-10-CM | POA: Diagnosis present

## 2023-07-11 DIAGNOSIS — Z888 Allergy status to other drugs, medicaments and biological substances status: Secondary | ICD-10-CM | POA: Diagnosis not present

## 2023-07-11 DIAGNOSIS — Z6826 Body mass index (BMI) 26.0-26.9, adult: Secondary | ICD-10-CM | POA: Diagnosis not present

## 2023-07-11 DIAGNOSIS — T7840XA Allergy, unspecified, initial encounter: Secondary | ICD-10-CM | POA: Diagnosis not present

## 2023-07-11 DIAGNOSIS — E8809 Other disorders of plasma-protein metabolism, not elsewhere classified: Secondary | ICD-10-CM | POA: Diagnosis not present

## 2023-07-11 DIAGNOSIS — G40919 Epilepsy, unspecified, intractable, without status epilepticus: Secondary | ICD-10-CM | POA: Diagnosis not present

## 2023-07-11 DIAGNOSIS — E663 Overweight: Secondary | ICD-10-CM | POA: Diagnosis present

## 2023-07-11 DIAGNOSIS — F419 Anxiety disorder, unspecified: Secondary | ICD-10-CM | POA: Diagnosis present

## 2023-07-11 DIAGNOSIS — R3989 Other symptoms and signs involving the genitourinary system: Secondary | ICD-10-CM

## 2023-07-11 DIAGNOSIS — A09 Infectious gastroenteritis and colitis, unspecified: Secondary | ICD-10-CM

## 2023-07-11 DIAGNOSIS — J4520 Mild intermittent asthma, uncomplicated: Secondary | ICD-10-CM | POA: Diagnosis not present

## 2023-07-11 DIAGNOSIS — Z79899 Other long term (current) drug therapy: Secondary | ICD-10-CM | POA: Diagnosis not present

## 2023-07-11 DIAGNOSIS — R197 Diarrhea, unspecified: Secondary | ICD-10-CM | POA: Diagnosis present

## 2023-07-11 DIAGNOSIS — N12 Tubulo-interstitial nephritis, not specified as acute or chronic: Secondary | ICD-10-CM | POA: Diagnosis present

## 2023-07-11 DIAGNOSIS — T368X5A Adverse effect of other systemic antibiotics, initial encounter: Secondary | ICD-10-CM | POA: Diagnosis present

## 2023-07-11 DIAGNOSIS — Z881 Allergy status to other antibiotic agents status: Secondary | ICD-10-CM

## 2023-07-11 DIAGNOSIS — R6521 Severe sepsis with septic shock: Secondary | ICD-10-CM | POA: Diagnosis present

## 2023-07-11 DIAGNOSIS — G40909 Epilepsy, unspecified, not intractable, without status epilepticus: Secondary | ICD-10-CM | POA: Diagnosis present

## 2023-07-11 DIAGNOSIS — D649 Anemia, unspecified: Secondary | ICD-10-CM | POA: Diagnosis not present

## 2023-07-11 DIAGNOSIS — A419 Sepsis, unspecified organism: Secondary | ICD-10-CM | POA: Diagnosis not present

## 2023-07-11 DIAGNOSIS — Z885 Allergy status to narcotic agent status: Secondary | ICD-10-CM

## 2023-07-11 DIAGNOSIS — G43909 Migraine, unspecified, not intractable, without status migrainosus: Secondary | ICD-10-CM | POA: Diagnosis not present

## 2023-07-11 DIAGNOSIS — Z880 Allergy status to penicillin: Secondary | ICD-10-CM

## 2023-07-11 DIAGNOSIS — Z793 Long term (current) use of hormonal contraceptives: Secondary | ICD-10-CM

## 2023-07-11 DIAGNOSIS — Z882 Allergy status to sulfonamides status: Secondary | ICD-10-CM

## 2023-07-11 HISTORY — DX: Unspecified asthma, uncomplicated: J45.909

## 2023-07-11 LAB — CBC WITH DIFFERENTIAL/PLATELET
Abs Immature Granulocytes: 0.1 10*3/uL — ABNORMAL HIGH (ref 0.00–0.07)
Basophils Absolute: 0 10*3/uL (ref 0.0–0.1)
Basophils Relative: 0 %
Eosinophils Absolute: 0.4 10*3/uL (ref 0.0–0.5)
Eosinophils Relative: 3 %
HCT: 42.5 % (ref 36.0–46.0)
Hemoglobin: 14.7 g/dL (ref 12.0–15.0)
Immature Granulocytes: 1 %
Lymphocytes Relative: 2 %
Lymphs Abs: 0.3 10*3/uL — ABNORMAL LOW (ref 0.7–4.0)
MCH: 30.9 pg (ref 26.0–34.0)
MCHC: 34.6 g/dL (ref 30.0–36.0)
MCV: 89.5 fL (ref 80.0–100.0)
Monocytes Absolute: 1.1 10*3/uL — ABNORMAL HIGH (ref 0.1–1.0)
Monocytes Relative: 8 %
Neutro Abs: 11.6 10*3/uL — ABNORMAL HIGH (ref 1.7–7.7)
Neutrophils Relative %: 86 %
Platelets: 237 10*3/uL (ref 150–400)
RBC: 4.75 MIL/uL (ref 3.87–5.11)
RDW: 12.4 % (ref 11.5–15.5)
WBC: 13.5 10*3/uL — ABNORMAL HIGH (ref 4.0–10.5)
nRBC: 0 % (ref 0.0–0.2)

## 2023-07-11 LAB — URINALYSIS, ROUTINE W REFLEX MICROSCOPIC
Bilirubin Urine: NEGATIVE
Glucose, UA: NEGATIVE mg/dL
Hgb urine dipstick: NEGATIVE
Ketones, ur: NEGATIVE mg/dL
Nitrite: NEGATIVE
Protein, ur: NEGATIVE mg/dL
Specific Gravity, Urine: 1.014 (ref 1.005–1.030)
pH: 5 (ref 5.0–8.0)

## 2023-07-11 LAB — POC URINE PREG, ED: Preg Test, Ur: NEGATIVE

## 2023-07-11 LAB — PROTIME-INR
INR: 1.2 (ref 0.8–1.2)
Prothrombin Time: 15.2 s (ref 11.4–15.2)

## 2023-07-11 LAB — LIPASE, BLOOD: Lipase: 33 U/L (ref 11–51)

## 2023-07-11 LAB — COMPREHENSIVE METABOLIC PANEL
ALT: 19 U/L (ref 0–44)
AST: 22 U/L (ref 15–41)
Albumin: 3.8 g/dL (ref 3.5–5.0)
Alkaline Phosphatase: 53 U/L (ref 38–126)
Anion gap: 10 (ref 5–15)
BUN: 11 mg/dL (ref 6–20)
CO2: 17 mmol/L — ABNORMAL LOW (ref 22–32)
Calcium: 8.8 mg/dL — ABNORMAL LOW (ref 8.9–10.3)
Chloride: 106 mmol/L (ref 98–111)
Creatinine, Ser: 0.87 mg/dL (ref 0.44–1.00)
GFR, Estimated: >60 mL/min (ref >60)
Glucose, Bld: 111 mg/dL — ABNORMAL HIGH (ref 70–99)
Potassium: 3.4 mmol/L — ABNORMAL LOW (ref 3.5–5.1)
Sodium: 133 mmol/L — ABNORMAL LOW (ref 135–145)
Total Bilirubin: 0.6 mg/dL (ref 0.0–1.2)
Total Protein: 6.9 g/dL (ref 6.5–8.1)

## 2023-07-11 LAB — PROCALCITONIN: Procalcitonin: 0.22 ng/mL

## 2023-07-11 LAB — MAGNESIUM: Magnesium: 1.8 mg/dL (ref 1.7–2.4)

## 2023-07-11 LAB — LACTIC ACID, PLASMA
Lactic Acid, Venous: 2.5 mmol/L (ref 0.5–1.9)
Lactic Acid, Venous: 1.3 mmol/L (ref 0.5–1.9)

## 2023-07-11 LAB — APTT: aPTT: 26 s (ref 24–36)

## 2023-07-11 MED ORDER — FENTANYL CITRATE PF 50 MCG/ML IJ SOSY: 12.5000 ug | PREFILLED_SYRINGE | INTRAMUSCULAR | Status: DC | PRN

## 2023-07-11 MED ORDER — ONDANSETRON HCL 4 MG/2ML IJ SOLN
4.0000 mg | Freq: Three times a day (TID) | INTRAMUSCULAR | Status: DC | PRN
  Administered 2023-07-11 – 2023-07-14 (×2): 4 mg via INTRAVENOUS
  Filled 2023-07-11 (×2): qty 2

## 2023-07-11 MED ORDER — MORPHINE SULFATE (PF) 4 MG/ML IV SOLN: 4.0000 mg | Freq: Once | INTRAVENOUS | Status: DC

## 2023-07-11 MED ORDER — HEPARIN SODIUM (PORCINE) 5000 UNIT/ML IJ SOLN
5000.0000 [IU] | Freq: Three times a day (TID) | INTRAMUSCULAR | Status: DC
  Administered 2023-07-11 – 2023-07-13 (×5): 5000 [IU] via SUBCUTANEOUS
  Filled 2023-07-11 (×6): qty 1

## 2023-07-11 MED ORDER — SODIUM CHLORIDE 0.9 % IV BOLUS
500.0000 mL | Freq: Once | INTRAVENOUS | Status: DC
Start: 2023-07-11 — End: 2023-07-11

## 2023-07-11 MED ORDER — NITROFURANTOIN MONOHYD MACRO 100 MG PO CAPS
100.0000 mg | ORAL_CAPSULE | Freq: Two times a day (BID) | ORAL | 0 refills | Status: DC
Start: 2023-07-11 — End: 2023-07-13

## 2023-07-11 MED ORDER — KETOROLAC TROMETHAMINE 15 MG/ML IJ SOLN
15.0000 mg | Freq: Once | INTRAMUSCULAR | Status: AC
  Administered 2023-07-11: 15 mg via INTRAVENOUS
  Filled 2023-07-11: qty 1

## 2023-07-11 MED ORDER — DM-GUAIFENESIN ER 30-600 MG PO TB12: 1.0000 | ORAL_TABLET | Freq: Two times a day (BID) | ORAL | Status: DC | PRN

## 2023-07-11 MED ORDER — POTASSIUM CHLORIDE CRYS ER 20 MEQ PO TBCR
20.0000 meq | EXTENDED_RELEASE_TABLET | Freq: Once | ORAL | Status: AC
  Administered 2023-07-11: 20 meq via ORAL
  Filled 2023-07-11: qty 1

## 2023-07-11 MED ORDER — SODIUM CHLORIDE 0.9 % IV BOLUS
1000.0000 mL | Freq: Once | INTRAVENOUS | Status: AC
  Administered 2023-07-11: 1000 mL via INTRAVENOUS

## 2023-07-11 MED ORDER — ALBUMIN HUMAN 25 % IV SOLN
25.0000 g | Freq: Once | INTRAVENOUS | Status: AC
  Administered 2023-07-11: 12.5 g via INTRAVENOUS
  Filled 2023-07-11: qty 100

## 2023-07-11 MED ORDER — DIPHENHYDRAMINE HCL 25 MG PO CAPS: 25.0000 mg | ORAL_CAPSULE | Freq: Four times a day (QID) | ORAL | Status: DC | PRN

## 2023-07-11 MED ORDER — SODIUM CHLORIDE 0.9 % IV SOLN
2.0000 g | Freq: Once | INTRAVENOUS | Status: AC
  Administered 2023-07-11: 2 g via INTRAVENOUS
  Filled 2023-07-11: qty 20

## 2023-07-11 MED ORDER — HYDROCORTISONE SOD SUC (PF) 100 MG IJ SOLR
100.0000 mg | Freq: Once | INTRAMUSCULAR | Status: AC
  Administered 2023-07-12: 100 mg via INTRAVENOUS
  Filled 2023-07-11: qty 2

## 2023-07-11 MED ORDER — ACETAMINOPHEN 325 MG PO TABS
650.0000 mg | ORAL_TABLET | Freq: Four times a day (QID) | ORAL | Status: DC | PRN
  Administered 2023-07-11 – 2023-07-14 (×4): 650 mg via ORAL
  Filled 2023-07-11 (×4): qty 2

## 2023-07-11 MED ORDER — ONDANSETRON HCL 4 MG/2ML IJ SOLN
4.0000 mg | Freq: Once | INTRAMUSCULAR | Status: AC
  Administered 2023-07-11: 4 mg via INTRAVENOUS
  Filled 2023-07-11: qty 2

## 2023-07-11 MED ORDER — OXYCODONE-ACETAMINOPHEN 5-325 MG PO TABS
1.0000 | ORAL_TABLET | ORAL | Status: DC | PRN
  Administered 2023-07-11 – 2023-07-13 (×3): 1 via ORAL
  Filled 2023-07-11 (×3): qty 1

## 2023-07-11 MED ORDER — ALBUTEROL SULFATE (2.5 MG/3ML) 0.083% IN NEBU: 3.0000 mL | INHALATION_SOLUTION | RESPIRATORY_TRACT | Status: DC | PRN

## 2023-07-11 MED ORDER — MIDODRINE HCL 5 MG PO TABS
10.0000 mg | ORAL_TABLET | Freq: Three times a day (TID) | ORAL | Status: DC
  Administered 2023-07-11 – 2023-07-13 (×5): 10 mg via ORAL
  Filled 2023-07-11 (×7): qty 2

## 2023-07-11 MED ORDER — SODIUM CHLORIDE 0.9 % IV SOLN
2.0000 g | INTRAVENOUS | Status: DC
  Administered 2023-07-12 – 2023-07-13 (×2): 2 g via INTRAVENOUS
  Filled 2023-07-11 (×3): qty 20

## 2023-07-11 MED ORDER — LORAZEPAM 2 MG/ML IJ SOLN: 2.0000 mg | INTRAMUSCULAR | Status: DC | PRN

## 2023-07-11 MED ORDER — SODIUM CHLORIDE 0.9 % IV SOLN: INTRAVENOUS | Status: DC

## 2023-07-11 NOTE — Sepsis Progress Note (Signed)
Elink will follow per sepsis protocol  

## 2023-07-11 NOTE — ED Notes (Signed)
Dr. Anner Crete informed of pt's lactic acid of 2.5, via EPIC secure chat.

## 2023-07-11 NOTE — ED Provider Notes (Signed)
[facility] Provider Note    Event Date/Time   First MD Initiated Contact with Patient 07/11/23 1508     (approximate)   History   Allergic Reaction   HPI Karron Kovacevic is a 33 y.o. female presenting today for abdominal pain.  Patient states she was diagnosed with a UTI yesterday after having cloudy urine and dysuria symptoms.  Started by her urologist on Bactrim.  Last night she started developing lower back pain, lightheadedness symptoms.  She has had intermittent nausea and worsening of the pain symptoms.  She thought she might be having allergic reaction to the Bactrim but has continued into today.  No obvious difficulty breathing.  Has had chills but no true fevers.  Denies any blood in her urine.  She reports having history of kidney stones with a being approximately 5 mm in her left kidney.  Denies passing the stones.     Physical Exam   Triage Vital Signs: ED Triage Vitals  Encounter Vitals Group     BP 07/11/23 1244 118/74     Systolic BP Percentile --      Diastolic BP Percentile --      Pulse Rate 07/11/23 1244 (!) 121     Resp 07/11/23 1244 18     Temp 07/11/23 1244 98.9 F (37.2 C)     Temp Source 07/11/23 1244 Oral     SpO2 07/11/23 1244 96 %     Weight 07/11/23 1247 160 lb (72.6 kg)     Height 07/11/23 1247 5\' 5"  (1.651 m)     Head Circumference --      Peak Flow --      Pain Score 07/11/23 1247 0     Pain Loc --      Pain Education --      Exclude from Growth Chart --     Most recent vital signs: Vitals:   07/11/23 1244 07/11/23 1542  BP: 118/74 99/68  Pulse: (!) 121 (!) 126  Resp: 18 20  Temp: 98.9 F (37.2 C)   SpO2: 96% 97%   Physical Exam: I have reviewed the vital signs and nursing notes. General: Awake, alert, no acute distress.  Nontoxic appearing. Head:  Atraumatic, normocephalic.   ENT:  EOM intact, PERRL. Oral mucosa is pink and moist with no lesions. Neck: Neck is supple with full range of motion, No  meningeal signs. Cardiovascular:  tachycardic, RR, No murmurs. Peripheral pulses palpable and equal bilaterally. Respiratory:  Symmetrical chest wall expansion.  No rhonchi, rales, or wheezes.  Good air movement throughout.  No use of accessory muscles.   Musculoskeletal:  No cyanosis or edema. Moving extremities with full ROM Abdomen:  Soft, mild tenderness palpation in left lower quadrant, CVA tenderness bilaterally worse on the left side, nondistended. Neuro:  GCS 15, moving all four extremities, interacting appropriately. Speech clear. Psych:  Calm, appropriate.   Skin:  Warm, dry, no rash.    ED Results / Procedures / Treatments   Labs (all labs ordered are listed, but only abnormal results are displayed) Labs Reviewed  CBC WITH DIFFERENTIAL/PLATELET - Abnormal; Notable for the following components:      Result Value   WBC 13.5 (*)    Neutro Abs 11.6 (*)    Lymphs Abs 0.3 (*)    Monocytes Absolute 1.1 (*)    Abs Immature Granulocytes 0.10 (*)    All other components within normal limits  COMPREHENSIVE METABOLIC PANEL - Abnormal; Notable for the  following components:   Sodium 133 (*)    Potassium 3.4 (*)    CO2 17 (*)    Glucose, Bld 111 (*)    Calcium 8.8 (*)    All other components within normal limits  URINALYSIS, ROUTINE W REFLEX MICROSCOPIC - Abnormal; Notable for the following components:   Color, Urine YELLOW (*)    APPearance CLEAR (*)    Leukocytes,Ua SMALL (*)    Bacteria, UA RARE (*)    All other components within normal limits  LACTIC ACID, PLASMA - Abnormal; Notable for the following components:   Lactic Acid, Venous 2.5 (*)    All other components within normal limits  CULTURE, BLOOD (ROUTINE X 2)  CULTURE, BLOOD (ROUTINE X 2)  LIPASE, BLOOD  LACTIC ACID, PLASMA  POC URINE PREG, ED     EKG    RADIOLOGY Independently interpreted CT abdomen/pelvis with no acute pathology   PROCEDURES:  Critical Care performed: Yes, see critical care procedure  note(s)  .Critical Care  Performed by: Janith Lima, MD Authorized by: Janith Lima, MD   Critical care provider statement:    Critical care time (minutes):  30   Critical care was necessary to treat or prevent imminent or life-threatening deterioration of the following conditions:  Sepsis   Critical care was time spent personally by me on the following activities:  Development of treatment plan with patient or surrogate, discussions with consultants, evaluation of patient's response to treatment, examination of patient, ordering and review of laboratory studies, ordering and review of radiographic studies, ordering and performing treatments and interventions, pulse oximetry, re-evaluation of patient's condition and review of old charts   I assumed direction of critical care for this patient from another provider in my specialty: no     Care discussed with: admitting provider      MEDICATIONS ORDERED IN ED: Medications  ondansetron (ZOFRAN) injection 4 mg (4 mg Intravenous Given 07/11/23 1629)  ketorolac (TORADOL) 15 MG/ML injection 15 mg (15 mg Intravenous Given 07/11/23 1628)  sodium chloride 0.9 % bolus 1,000 mL (0 mLs Intravenous Stopped 07/11/23 1703)  cefTRIAXone (ROCEPHIN) 2 g in sodium chloride 0.9 % 100 mL IVPB (0 g Intravenous Stopped 07/11/23 1736)  sodium chloride 0.9 % bolus 1,000 mL (1,000 mLs Intravenous New Bag/Given 07/11/23 1733)     IMPRESSION / MDM / ASSESSMENT AND PLAN / ED COURSE  I reviewed the triage vital signs and the nursing notes.                              Differential diagnosis includes, but is not limited to, acute cystitis, nephrolithiasis, pyelonephritis, septic kidney stone, diverticulitis  Patient's presentation is most consistent with acute presentation with potential threat to life or bodily function.  Patient is a 33 year old female presenting today for lightheadedness, low back pain, and nausea in the setting of recent urinary tract infection  diagnosis.  Patient was started on Bactrim yesterday and overnight started having worsening lower back pain, lightheadedness, chills, and nausea.  Believed it was potentially a reaction to the medicine.  No obvious hives on exam here at this time and no difficulty breathing or wheezing.  Patient is tachycardic and concern for possible sepsis secondary to urinary tract infection versus pyelonephritis or infected stone with history of prior kidney stones.  Laboratory workup shows leukocytosis at 13.5.  Lactic acid elevated at 2.5 and patient was given ceftriaxone 1 L fluids.  CT abdomen/pelvis shows stones within the kidneys but none distally in the ureter.  Given her CVA tenderness, suspect more pyelonephritis meeting sepsis criteria.  Patient was given second liter of fluids and continued to have elevated heart rate.  Given persistent tachycardia in the setting of sepsis, will admit to hospitalist for further care.  The patient is on the cardiac monitor to evaluate for evidence of arrhythmia and/or significant heart rate changes. Clinical Course as of 07/11/23 1819  Wed Jul 11, 2023  1816 Still tachycardic in the mid 110s following second liter of fluids.  Will admit for sepsis secondary to pyelonephritis [DW]    Clinical Course User Index [DW] Janith Lima, MD     FINAL CLINICAL IMPRESSION(S) / ED DIAGNOSES   Final diagnoses:  Pyelonephritis  Sepsis, due to unspecified organism, unspecified whether acute organ dysfunction present [facility])     Rx / DC Orders   ED Discharge Orders     None        Note:  This document was prepared using Dragon voice recognition software and may include unintentional dictation errors.   Janith Lima, MD 07/11/23 [phone]

## 2023-07-11 NOTE — Telephone Encounter (Signed)
Patient left a triage message at 1:04pm. Patient states she had a reaction  to a sulfra drug that she started two days ago. The message states she call her pharmacy and they told her to stop the medication and call the office to found out what other medication she may take. The voice mail did not state what the reaction was to the medication .  I was going to call the patient back but she is already in the Er . Per epic.

## 2023-07-11 NOTE — ED Triage Notes (Signed)
Pt reports possible allergic reaction to bactrim that she started last night for a UTI. Pt reports chills and a rash on her face

## 2023-07-11 NOTE — H&P (Incomplete)
History and Physical    Kimberly Villanueva ZOX:[id number] DOB: 01/06/1991 DOA: 07/11/2023  Referring MD/NP/PA:   PCP: [facility]   Patient coming from:  The patient is coming from home.     Chief Complaint: Dysuria, chills, allergic reaction  HPI: Kimberly Villanueva is a 33 y.o. female with medical history significant of kidney stone, asthma, depression with anxiety, seizure, migraine, who presents with dysuria, chills, allergic reaction.  Patient was initially diagnosed with nonobstructive kidney stone in September 2024.  Patient is following up with urologist, Dr. Richardo Hanks. Due to symptoms of UTI, including dysuria, burning with urination, increased urinary frequency and positive urinalysis, pt was started on Bactrim for pyelonephritis on 1/21.  After started taking this medication, patient developed allergic reaction with rash on her face, arms and upper trunk, also had mild shortness of breath, which has resolved.  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 Her rash has almost resolved when I saw patient in ED.  Patient has chills, no fever.  No cough, chest pain.  Patient has lower abdominal pain, which is sharp, severe, radiating to the bilateral flank area and lower back, associated with nausea, no vomiting. She had 1 episode of diarrhea today.  Currently no active diarrhea.  Of note, patient is taking Topamax for seizure.  Her urologist recommend switching to different antiseizure medication after talking to her neurologist.  He had appointment with her neurologist 1/27, not seen yet.  He is still taking Topamax currently.  Initially her Bp is 95/59, but has dropped to 76/57 (MAP 65) after having received total of 3 L of NS bolus in ED. The 4th L of NS bolus is ordered. Also ordered 100 mg of Solu-Cortef, 25 mg of albumin, started on midodrine 10 mg 3 times daily.    Data reviewed independently and ED Course: pt was found to have positive urinalysis (cloudy appearance, small amount of  leukocyte, rare bacteria, WBC 21-50), WBC 13.5, lactic acid 2.5, 1.3, negative pregnancy test, potassium 3.4, GFR> 60.  Patient is admitted to PCU as inpatient.   CT scan per renal stone protocol: 1. There are at least 3, nonobstructing calculi in the left kidney with largest measuring up to 5 mm. No other nephroureterolithiasis or obstructive uropathy on either side. 2. Multiple other nonacute observations, as described above.   EKG: Not done in ED, will get one.      Review of Systems:   General: no fevers, has chills, no body weight gain, has fatigue HEENT: no blurry vision, hearing changes or sore throat Respiratory: no dyspnea, coughing, wheezing CV: no chest pain, no palpitations GI: Has nausea, abdominal pain, diarrhea. GU: has dysuria, burning on urination, increased urinary frequency Ext: no leg edema Neuro: no unilateral weakness, numbness, or tingling, no vision change or hearing loss Skin: has rashes MSK: No muscle spasm, no deformity, no limitation of range of movement in spin Heme: No easy bruising.  Travel history: No recent long distant travel.   Allergy:  Allergies  Allergen Reactions   Amoxicillin Other (See Comments)    Shortness of breath, palpitations as infanct  Other Reaction(s): Other (See Comments)  Shortness of breath, palpitations as infant   Codeine Palpitations   Lamotrigine Other (See Comments)    hallucinations  Other Reaction(s): Hallucination   Levetiracetam Other (See Comments)    hallucinations  Other Reaction(s): Hallucination   Topiramate Other (See Comments)    Depression/suicidal   Generic only causes side effects  Other Reaction(s): Hallucination   Bactrim [  Sulfamethoxazole-Trimethoprim] Rash    Past Medical History:  Diagnosis Date   Anxiety    Asthma    Depression    Epilepsy (HCC)    Migraine     Past Surgical History:  Procedure Laterality Date   COLONOSCOPY      Social History:  reports that she has  never smoked. She has never been exposed to tobacco smoke. She has never used smokeless tobacco. She reports that she does not drink alcohol and does not use drugs.  Family History:  Family History  Problem Relation Age of Onset   Hashimoto's thyroiditis Mother    Kidney Stones Father      Prior to Admission medications   Medication Sig Start Date End Date Taking? Authorizing Provider  drospirenone-ethinyl estradiol (YAZ) 3-0.02 MG tablet Take 1 tablet by mouth daily. 10/27/20   [provider]  fexofenadine (ALLEGRA) 180 MG tablet Take by mouth.    [provider]  fluticasone (FLONASE) 50 MCG/ACT nasal spray Place 2 sprays into the nose as needed.    [provider]  LORazepam (ATIVAN) 0.5 MG tablet as needed. 08/12/10   [provider]  montelukast (SINGULAIR) 10 MG tablet Take by mouth. 08/17/17   [provider]  naproxen (NAPROSYN) 500 MG tablet Take 500 mg by mouth as needed. 08/11/21   [provider]  nitrofurantoin, macrocrystal-monohydrate, (MACROBID) 100 MG capsule Take 1 capsule (100 mg total) by mouth 2 (two) times daily. 07/11/23   Sondra Come, MD  ondansetron (ZOFRAN-ODT) 4 MG disintegrating tablet Take 1 tablet (4 mg total) by mouth every 8 (eight) hours as needed for nausea or vomiting. 03/12/23   Irean Hong, MD  sertraline (ZOLOFT) 100 MG tablet Take 200 mg by mouth daily. 03/15/23 03/09/24  [provider]  topiramate (TOPAMAX) 200 MG tablet Take 1 tablet by mouth 2 (two) times daily. 08/23/17 09/14/21  [provider]  zolmitriptan (ZOMIG-ZMT) 5 MG disintegrating tablet Take 5 mg by mouth as needed. 05/07/21   [provider]    Physical Exam: Vitals:   07/12/23 0754 07/12/23 0830 07/12/23 0930 07/12/23 1015  BP:  103/76 110/69 97/76  Pulse:  70 75 74  Resp:  (!) 26 (!) 29 (!) 26  Temp: 98.2 F (36.8 C)     TempSrc: Oral     SpO2:  99% 99% 98%  Weight:      Height:       General: Not  in acute distress HEENT:       Eyes: PERRL, EOMI, no jaundice       ENT: No discharge from the ears and nose, no pharynx injection, no tonsillar enlargement.        Neck: No JVD, no bruit, no mass felt. Heme: No neck lymph node enlargement. Cardiac: S1/S2, RRR, No murmurs, No gallops or rubs. Respiratory: No rales, wheezing, rhonchi or rubs. GI: Soft, nondistended, nontender, no rebound pain, no organomegaly, BS present. GU: has positive left CVA tenderness Ext: No pitting leg edema bilaterally. 1+DP/PT pulse bilaterally. Musculoskeletal: No joint deformities, No joint redness or warmth, no limitation of ROM in spin. Skin: has rashes.  Neuro: Alert, oriented X3, cranial nerves II-XII grossly intact, moves all extremities normally.  Psych: Patient is not psychotic, no suicidal or hemocidal ideation.  Labs on Admission: I have personally reviewed following labs and imaging studies  CBC: Recent Labs  Lab 07/11/23 1529 07/12/23 0619  WBC 13.5* 10.6*  NEUTROABS 11.6*  --  HGB 14.7 12.4  HCT 42.5 36.7  MCV 89.5 92.4  PLT 237 175   Basic Metabolic Panel: Recent Labs  Lab 07/11/23 1529 07/12/23 0619  NA 133* 138  K 3.4* 3.3*  CL 106 115*  CO2 17* 15*  GLUCOSE 111* 172*  BUN 11 7  CREATININE 0.87 0.75  CALCIUM 8.8* 7.1*  MG 1.8  --    GFR: Estimated Creatinine Clearance: 99.8 mL/min (by C-G formula based on SCr of 0.75 mg/dL). Liver Function Tests: Recent Labs  Lab 07/11/23 1529  AST 22  ALT 19  ALKPHOS 53  BILITOT 0.6  PROT 6.9  ALBUMIN 3.8   Recent Labs  Lab 07/11/23 1529  LIPASE 33   No results for input(s): "AMMONIA" in the last 168 hours. Coagulation Profile: Recent Labs  Lab 07/11/23 2000  INR 1.2   Cardiac Enzymes: No results for input(s): "CKTOTAL", "CKMB", "CKMBINDEX", "TROPONINI" in the last 168 hours. BNP (last 3 results) No results for input(s): "PROBNP" in the last 8760 hours. HbA1C: No results for input(s): "HGBA1C" in the last 72  hours. CBG: No results for input(s): "GLUCAP" in the last 168 hours. Lipid Profile: No results for input(s): "CHOL", "HDL", "LDLCALC", "TRIG", "CHOLHDL", "LDLDIRECT" in the last 72 hours. Thyroid Function Tests: No results for input(s): "TSH", "T4TOTAL", "FREET4", "T3FREE", "THYROIDAB" in the last 72 hours. Anemia Panel: No results for input(s): "VITAMINB12", "FOLATE", "FERRITIN", "TIBC", "IRON", "RETICCTPCT" in the last 72 hours. Urine analysis:    Component Value Date/Time   COLORURINE YELLOW (A) 07/11/2023 1529   APPEARANCEUR CLEAR (A) 07/11/2023 1529   LABSPEC 1.014 07/11/2023 1529   PHURINE 5.0 07/11/2023 1529   GLUCOSEU NEGATIVE 07/11/2023 1529   HGBUR NEGATIVE 07/11/2023 1529   BILIRUBINUR NEGATIVE 07/11/2023 1529   KETONESUR NEGATIVE 07/11/2023 1529   PROTEINUR NEGATIVE 07/11/2023 1529   NITRITE NEGATIVE 07/11/2023 1529   LEUKOCYTESUR SMALL (A) 07/11/2023 1529   Sepsis Labs: @LABRCNTIP (procalcitonin:4,lacticidven:4) ) Recent Results (from the past 240 hours)  Urine Culture     Status: Abnormal   Collection Time: 07/10/23  9:51 AM   Specimen: Urine, Clean Catch  Result Value Ref Range Status   Specimen Description   Final    URINE, CLEAN CATCH Performed at [facility], [address] [place], [place] [address]    Special Requests   Final    NONE Performed at [facility], [address] [place], [place] [address]    Culture >=100,000 COLONIES/mL ESCHERICHIA COLI (A)  Final   Report Status 07/12/2023 FINAL  Final   Organism ID, Bacteria ESCHERICHIA COLI (A)  Final      Susceptibility   Escherichia coli - MIC*    AMPICILLIN >=32 RESISTANT Resistant     CEFAZOLIN 16 SENSITIVE Sensitive     CEFEPIME <=0.12 SENSITIVE Sensitive     CEFTRIAXONE <=0.25 SENSITIVE Sensitive     CIPROFLOXACIN <=0.25 SENSITIVE Sensitive     GENTAMICIN >=16 RESISTANT Resistant     IMIPENEM <=0.25 SENSITIVE Sensitive     NITROFURANTOIN <=16 SENSITIVE Sensitive      TRIMETH/SULFA >=320 RESISTANT Resistant     AMPICILLIN/SULBACTAM >=32 RESISTANT Resistant     PIP/TAZO <=4 SENSITIVE Sensitive ug/mL    * >=100,000 COLONIES/mL ESCHERICHIA COLI  Blood culture (routine x 2)     Status: None (Preliminary result)   Collection Time: 07/11/23  6:52 PM   Specimen: BLOOD  Result Value Ref Range Status   Specimen Description BLOOD BLOOD LEFT ARM  Final  Special Requests   Final    BOTTLES DRAWN AEROBIC AND ANAEROBIC Blood Culture adequate volume   Culture   Final    NO GROWTH < 12 HOURS Performed at [facility], [address] [place], [place] [address]    Report Status PENDING  Incomplete  Blood culture (routine x 2)     Status: None (Preliminary result)   Collection Time: 07/11/23  6:52 PM   Specimen: BLOOD  Result Value Ref Range Status   Specimen Description BLOOD BLOOD RIGHT ARM  Final   Special Requests   Final    BOTTLES DRAWN AEROBIC AND ANAEROBIC Blood Culture adequate volume   Culture   Final    NO GROWTH < 12 HOURS Performed at [facility], [address] [place], [place] [address]    Report Status PENDING  Incomplete     Radiological Exams on Admission:   Assessment/Plan Principal Problem:   Acute pyelonephritis Active Problems:   Severe sepsis with septic shock (CODE) (HCC)   Kidney stone   Migraine   Hypokalemia   Asthma   Allergic reaction   Diarrhea   Epilepsy (HCC)   Overweight (BMI 25.0-29.9)   Assessment and Plan:  Severe sepsis with septic shock due to acute pyelonephritis in the setting of non-obstructive kidney stone:  pt meets criteria for severe sepsis with WBC 13.5, tachycardic with heart rate up to 126, tachypnea with RR 29.  Lactic acid up to 2.5 --> 1.3.  Initially her Bp is 95/59, but has dropped to 76/57 (MAP 65) after having received total of 3 L of NS bolus in ED. The 4th L of NS bolus is ordered. Also ordered 100 mg of Solu-Cortef, 25 mg of albumin, started on midodrine 10 mg 3  times daily. If bp is not maintaining above SBP 90, will need to start vasopressor, Levophed and consult ICU.  -will admit to CPU as inpt -IV Rocephin -Follow-up blood culture and urine culture -IV fluid: Total of 4.5 L normal saline, and 125 cc/h of maintenance of IV fluid -Check procalcitonin level  Migraine -continue home prn zolmigriptan  Hypokalemia: K 3.4. Mg 1.8 -Repleted potassium  Asthma: Stable -As needed albuterol, Mucinex -Singulair  Allergic reaction -As needed Benadryl  Diarrhea: Patient reports 1 episode of diarrhea, currently no active diarrhea. -Observe closely -If develops more diarrhea, will check C. difficile and GI pathogen panel  Epilepsy [facility]): Discussed with patient about Topamax use, patient with leg to continue Topamax and follow-up with Dr. Deatra James, appointment is made on 1/27 -Seizure precaution -As needed Ativan for seizure -Topamax 200 mg twice daily  Overweight (BMI 25.0-29.9): Body weight 72.6 kg, BMI 26.63 -Encourage losing weight -Exercise and healthy diet         CRITICAL CARE Performed by: Lorretta Harp   Total critical care time:  70 minutes  Critical care time was exclusive of separately billable procedures and treating other patients.  Critical care was necessary to treat or prevent imminent or life-threatening deterioration.  Critical care was time spent personally by me on the following activities: development of treatment plan with patient and/or surrogate as well as nursing, discussions with consultants, evaluation of patient's response to treatment, examination of patient, obtaining history from patient or surrogate, ordering and performing treatments and interventions, ordering and review of laboratory studies, ordering and review of radiographic studies, pulse oximetry and re-evaluation of patient's condition.            DVT ppx: SQ Heparin  Code Status: Full code     Family Communication:  Yes,  patient's boyfriend   at bed side.       Disposition Plan:  Anticipate discharge back to previous environment  Consults called:  none  Admission status and Level of care: Stepdown:    for obs as inpt        Dispo: The patient is from: Home              Anticipated d/c is to: Home              Anticipated d/c date is: 2 days              Patient currently is not medically stable to d/c.     Severity of Illness:  The appropriate patient status for this patient is INPATIENT. Inpatient status is judged to be reasonable and necessary in order to provide the required intensity of service to ensure the patient's safety. The patient's presenting symptoms, physical exam findings, and initial radiographic and laboratory data in the context of their chronic comorbidities is felt to place them at high risk for further clinical deterioration. Furthermore, it is not anticipated that the patient will be medically stable for discharge from the hospital within 2 midnights of admission.   * I certify that at the point of admission it is my clinical judgment that the patient will require inpatient hospital care spanning beyond 2 midnights from the point of admission due to high intensity of service, high risk for further deterioration and high frequency of surveillance required.*       Date of Service 07/12/2023    Lorretta Harp [facility]   If 7PM-7AM, please contact night-coverage [email or web address] 07/12/2023, 10:37 AM

## 2023-07-11 NOTE — ED Notes (Signed)
Pt took 200mg  of Topamax at this time from her home medication which provider MD Clyde Lundborg stated was alright.

## 2023-07-11 NOTE — ED Notes (Signed)
Patient transported to CT 

## 2023-07-11 NOTE — H&P (Incomplete)
History and Physical    Kimberly Villanueva QIO:[id number] DOB: 1991/03/11 DOA: 07/11/2023  Referring MD/NP/PA:   PCP: [facility]   Patient coming from:  The patient is coming from home.     Chief Complaint: Dysuria, chills, allergic reaction  HPI: Kimberly Villanueva is a 33 y.o. female with medical history significant of kidney stone, asthma, depression with anxiety, seizure, migraine, who presents with dysuria, chills, allergic reaction.  Patient was initially diagnosed with nonobstructive kidney stone in September 2024.  Patient is following up with urologist, Dr. Richardo Hanks. Due to symptoms of UTI, including dysuria, burning with urination, increased urinary frequency and positive urinalysis, pt was started on Bactrim for pyelonephritis on 1/21.  After started taking this medication, patient developed allergic reaction with rash on her face, arms and upper trunk, also had mild shortness of breath, which has resolved.  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 Her rash has almost resolved when I saw patient in ED.  Patient has chills, no fever.  No cough, chest pain.  Patient has lower abdominal pain, which is sharp, severe, radiating to the bilateral flank area and lower back, associated with nausea, no vomiting. She had 1 episode of diarrhea today.  Currently no active diarrhea.  Of note, patient is taking Topamax for seizure.  Her urologist recommend switching to different antiseizure medication after talking to her neurologist.  He had appointment with her neurologist 1/27, not seen yet.  He is still taking Topamax currently.  Initially her Bp is 95/59, but has dropped to 76/57 (MAP 65) after having received total of 3 L of NS bolus in ED. The 4th L of NS bolus is ordered. Also ordered 100 mg of Solu-Cortef, 25 mg of albumin, started on midodrine 10 mg 3 times daily in ED.    Data reviewed independently and ED Course: pt was found to have positive urinalysis (cloudy appearance, small amount  of leukocyte, rare bacteria, WBC 21-50), WBC 13.5, lactic acid 2.5, 1.3, negative pregnancy test, potassium 3.4, GFR> 60.  Patient is admitted to PCU as inpatient.   CT scan per renal stone protocol: 1. There are at least 3, nonobstructing calculi in the left kidney with largest measuring up to 5 mm. No other nephroureterolithiasis or obstructive uropathy on either side. 2. Multiple other nonacute observations, as described above.   EKG: Not done in ED, will get one.      Review of Systems:   General: no fevers, has chills, no body weight gain, has fatigue HEENT: no blurry vision, hearing changes or sore throat Respiratory: no dyspnea, coughing, wheezing CV: no chest pain, no palpitations GI: Has nausea, abdominal pain, diarrhea. GU: has dysuria, burning on urination, increased urinary frequency Ext: no leg edema Neuro: no unilateral weakness, numbness, or tingling, no vision change or hearing loss Skin: has rashes MSK: No muscle spasm, no deformity, no limitation of range of movement in spin Heme: No easy bruising.  Travel history: No recent long distant travel.   Allergy:  Allergies  Allergen Reactions  . Amoxicillin Other (See Comments)    Shortness of breath, palpitations as infanct  Other Reaction(s): Other (See Comments)  Shortness of breath, palpitations as infant  . Codeine Palpitations  . Lamotrigine Other (See Comments)    hallucinations  Other Reaction(s): Hallucination  . Levetiracetam Other (See Comments)    hallucinations  Other Reaction(s): Hallucination  . Topiramate Other (See Comments)    Depression/suicidal   Generic only causes side effects  Other Reaction(s): Hallucination  .  Bactrim [Sulfamethoxazole-Trimethoprim] Rash    Past Medical History:  Diagnosis Date  . Anxiety   . Asthma   . Depression   . Epilepsy (HCC)   . Migraine     History reviewed. No pertinent surgical history.  Social History:  reports that she has never  smoked. She has never been exposed to tobacco smoke. She has never used smokeless tobacco. She reports that she does not drink alcohol and does not use drugs.  Family History: History reviewed. No pertinent family history.   Prior to Admission medications   Medication Sig Start Date End Date Taking? Authorizing Provider  drospirenone-ethinyl estradiol (YAZ) 3-0.02 MG tablet Take 1 tablet by mouth daily. 10/27/20   [provider]  fexofenadine (ALLEGRA) 180 MG tablet Take by mouth.    [provider]  fluticasone (FLONASE) 50 MCG/ACT nasal spray Place 2 sprays into the nose as needed.    [provider]  LORazepam (ATIVAN) 0.5 MG tablet as needed. 08/12/10   [provider]  montelukast (SINGULAIR) 10 MG tablet Take by mouth. 08/17/17   [provider]  naproxen (NAPROSYN) 500 MG tablet Take 500 mg by mouth as needed. 08/11/21   [provider]  nitrofurantoin, macrocrystal-monohydrate, (MACROBID) 100 MG capsule Take 1 capsule (100 mg total) by mouth 2 (two) times daily. 07/11/23   Sondra Come, MD  ondansetron (ZOFRAN-ODT) 4 MG disintegrating tablet Take 1 tablet (4 mg total) by mouth every 8 (eight) hours as needed for nausea or vomiting. 03/12/23   Irean Hong, MD  sertraline (ZOLOFT) 100 MG tablet Take 200 mg by mouth daily. 03/15/23 03/09/24  [provider]  topiramate (TOPAMAX) 200 MG tablet Take 1 tablet by mouth 2 (two) times daily. 08/23/17 09/14/21  [provider]  zolmitriptan (ZOMIG-ZMT) 5 MG disintegrating tablet Take 5 mg by mouth as needed. 05/07/21   [provider]    Physical Exam: Vitals:   07/11/23 2100 07/11/23 2214 07/11/23 2245 07/11/23 2258  BP: (!) 73/58  (!) 81/52   Pulse: (!) 127  (!) 137   Resp: (!) 27  (!) 25   Temp:  (!) 102.9 F (39.4 C)  99.8 F (37.7 C)  TempSrc:  Axillary  Oral  SpO2: 100%  97%   Weight:      Height:       General: Not in acute distress HEENT:       Eyes:  PERRL, EOMI, no jaundice       ENT: No discharge from the ears and nose, no pharynx injection, no tonsillar enlargement.        Neck: No JVD, no bruit, no mass felt. Heme: No neck lymph node enlargement. Cardiac: S1/S2, RRR, No murmurs, No gallops or rubs. Respiratory: No rales, wheezing, rhonchi or rubs. GI: Soft, nondistended, nontender, no rebound pain, no organomegaly, BS present. GU: has positive left CVA tenderness Ext: No pitting leg edema bilaterally. 1+DP/PT pulse bilaterally. Musculoskeletal: No joint deformities, No joint redness or warmth, no limitation of ROM in spin. Skin: has rashes.  Neuro: Alert, oriented X3, cranial nerves II-XII grossly intact, moves all extremities normally.  Psych: Patient is not psychotic, no suicidal or hemocidal ideation.  Labs on Admission: I have personally reviewed following labs and imaging studies  CBC: Recent Labs  Lab 07/11/23 1529  WBC 13.5*  NEUTROABS 11.6*  HGB 14.7  HCT 42.5  MCV 89.5  PLT 237   Basic Metabolic Panel: Recent Labs  Lab 07/11/23 1529  NA 133*  K 3.4*  CL 106  CO2 17*  GLUCOSE 111*  BUN 11  CREATININE 0.87  CALCIUM 8.8*  MG 1.8   GFR: Estimated Creatinine Clearance: 91.8 mL/min (by C-G formula based on SCr of 0.87 mg/dL). Liver Function Tests: Recent Labs  Lab 07/11/23 1529  AST 22  ALT 19  ALKPHOS 53  BILITOT 0.6  PROT 6.9  ALBUMIN 3.8   Recent Labs  Lab 07/11/23 1529  LIPASE 33   No results for input(s): "AMMONIA" in the last 168 hours. Coagulation Profile: Recent Labs  Lab 07/11/23 2000  INR 1.2   Cardiac Enzymes: No results for input(s): "CKTOTAL", "CKMB", "CKMBINDEX", "TROPONINI" in the last 168 hours. BNP (last 3 results) No results for input(s): "PROBNP" in the last 8760 hours. HbA1C: No results for input(s): "HGBA1C" in the last 72 hours. CBG: No results for input(s): "GLUCAP" in the last 168 hours. Lipid Profile: No results for input(s): "CHOL", "HDL", "LDLCALC",  "TRIG", "CHOLHDL", "LDLDIRECT" in the last 72 hours. Thyroid Function Tests: No results for input(s): "TSH", "T4TOTAL", "FREET4", "T3FREE", "THYROIDAB" in the last 72 hours. Anemia Panel: No results for input(s): "VITAMINB12", "FOLATE", "FERRITIN", "TIBC", "IRON", "RETICCTPCT" in the last 72 hours. Urine analysis:    Component Value Date/Time   COLORURINE YELLOW (A) 07/11/2023 1529   APPEARANCEUR CLEAR (A) 07/11/2023 1529   LABSPEC 1.014 07/11/2023 1529   PHURINE 5.0 07/11/2023 1529   GLUCOSEU NEGATIVE 07/11/2023 1529   HGBUR NEGATIVE 07/11/2023 1529   BILIRUBINUR NEGATIVE 07/11/2023 1529   KETONESUR NEGATIVE 07/11/2023 1529   PROTEINUR NEGATIVE 07/11/2023 1529   NITRITE NEGATIVE 07/11/2023 1529   LEUKOCYTESUR SMALL (A) 07/11/2023 1529   Sepsis Labs: @LABRCNTIP (procalcitonin:4,lacticidven:4) ) Recent Results (from the past 240 hours)  Urine Culture     Status: Abnormal (Preliminary result)   Collection Time: 07/10/23  9:51 AM   Specimen: Urine, Clean Catch  Result Value Ref Range Status   Specimen Description   Final    URINE, CLEAN CATCH Performed at [facility], [address], [place], [place] [address]    Special Requests   Final    NONE Performed at [facility], [address] [place], [place] [address]    Culture (A)  Final    >=100,000 COLONIES/mL ESCHERICHIA COLI CULTURE REINCUBATED FOR BETTER GROWTH SUSCEPTIBILITIES TO FOLLOW Performed at [facility], [address], [place], [place] [address]    Report Status PENDING  Incomplete     Radiological Exams on Admission:   Assessment/Plan Principal Problem:   Acute pyelonephritis Active Problems:   Severe sepsis (HCC)   Kidney stone   Migraine   Hypokalemia   Asthma   Allergic reaction   Diarrhea   Epilepsy (HCC)   Overweight (BMI 25.0-29.9)   Assessment and Plan:   Principal Problem:   Acute pyelonephritis Active Problems:   Severe sepsis (HCC)   Kidney  stone   Migraine   Hypokalemia   Asthma   Allergic reaction   Diarrhea   Epilepsy (HCC)   Overweight (BMI 25.0-29.9)    DVT ppx: SQ Heparin    Code Status: Full code   ***  Family Communication: not done, no family member is at bed side.              Yes, patient's    at bed side.       by phone  Disposition Plan:  Anticipate discharge back to previous environment  Consults called:  Admission status and Level of care: Progressive:    for obs as inpt        Dispo: The patient is from: {From:23814}              Anticipated d/c is to: {To:23815}              Anticipated d/c date is: {Days:23816}              Patient currently {Medically stable:23817}    Severity of Illness:  {Observation/Inpatient:21159}       Date of Service 07/12/2023    Lorretta Harp [facility]   If 7PM-7AM, please contact night-coverage [email or web address] 07/12/2023, 12:02 AM

## 2023-07-11 NOTE — ED Provider Triage Note (Signed)
Emergency Medicine Provider Triage Evaluation Note  Kimberly Villanueva , a 33 y.o. female  was evaluated in triage.  Pt complains of allergic reaction to Bactrim. She started it yesterday due to UTI. Complaining of bodyaches, abdominal pain, and rash to face and chest.  Physical Exam  BP 118/74 (BP Location: Left Arm)   Pulse (!) 121   Temp 98.9 F (37.2 C) (Oral)   Resp 18   LMP 06/26/2023   SpO2 96%  Gen:   Awake, no distress   Resp:  Normal effort. No distress MSK:   Moves extremities without difficulty  Other:    Medical Decision Making  Medically screening exam initiated at 12:47 PM.  Appropriate orders placed.  Katey Arch was informed that the remainder of the evaluation will be completed by another provider, this initial triage assessment does not replace that evaluation, and the importance of remaining in the ED until their evaluation is complete.     Chinita Pester, FNP 07/11/23 1249

## 2023-07-12 ENCOUNTER — Encounter: Payer: Self-pay | Admitting: Internal Medicine

## 2023-07-12 DIAGNOSIS — R6521 Severe sepsis with septic shock: Secondary | ICD-10-CM | POA: Diagnosis not present

## 2023-07-12 DIAGNOSIS — N10 Acute tubulo-interstitial nephritis: Secondary | ICD-10-CM | POA: Diagnosis not present

## 2023-07-12 DIAGNOSIS — G40919 Epilepsy, unspecified, intractable, without status epilepticus: Secondary | ICD-10-CM | POA: Diagnosis not present

## 2023-07-12 DIAGNOSIS — J4520 Mild intermittent asthma, uncomplicated: Secondary | ICD-10-CM | POA: Diagnosis not present

## 2023-07-12 LAB — HEMOGLOBIN A1C
Hgb A1c MFr Bld: 4.9 % (ref 4.8–5.6)
Mean Plasma Glucose: 93.93 mg/dL

## 2023-07-12 LAB — BASIC METABOLIC PANEL
Anion gap: 8 (ref 5–15)
BUN: 7 mg/dL (ref 6–20)
CO2: 15 mmol/L — ABNORMAL LOW (ref 22–32)
Calcium: 7.1 mg/dL — ABNORMAL LOW (ref 8.9–10.3)
Chloride: 115 mmol/L — ABNORMAL HIGH (ref 98–111)
Creatinine, Ser: 0.75 mg/dL (ref 0.44–1.00)
GFR, Estimated: >60 mL/min (ref >60)
Glucose, Bld: 172 mg/dL — ABNORMAL HIGH (ref 70–99)
Potassium: 3.3 mmol/L — ABNORMAL LOW (ref 3.5–5.1)
Sodium: 138 mmol/L (ref 135–145)

## 2023-07-12 LAB — CBC
HCT: 36.7 % (ref 36.0–46.0)
Hemoglobin: 12.4 g/dL (ref 12.0–15.0)
MCH: 31.2 pg (ref 26.0–34.0)
MCHC: 33.8 g/dL (ref 30.0–36.0)
MCV: 92.4 fL (ref 80.0–100.0)
Platelets: 175 10*3/uL (ref 150–400)
RBC: 3.97 MIL/uL (ref 3.87–5.11)
RDW: 12.9 % (ref 11.5–15.5)
WBC: 10.6 10*3/uL — ABNORMAL HIGH (ref 4.0–10.5)
nRBC: 0 % (ref 0.0–0.2)

## 2023-07-12 LAB — CORTISOL: Cortisol, Plasma: 25 ug/dL

## 2023-07-12 LAB — URINE CULTURE: Culture: >=100000 — AB

## 2023-07-12 LAB — HIV ANTIBODY (ROUTINE TESTING W REFLEX): HIV Screen 4th Generation wRfx: NONREACTIVE

## 2023-07-12 MED ORDER — MONTELUKAST SODIUM 10 MG PO TABS
10.0000 mg | ORAL_TABLET | Freq: Every day | ORAL | Status: DC
Start: 2023-07-12 — End: 2023-07-14
  Administered 2023-07-12 – 2023-07-13 (×2): 10 mg via ORAL
  Filled 2023-07-12 (×2): qty 1

## 2023-07-12 MED ORDER — SODIUM CHLORIDE 0.9 % IV BOLUS
500.0000 mL | Freq: Once | INTRAVENOUS | Status: AC
  Administered 2023-07-12: 500 mL via INTRAVENOUS

## 2023-07-12 MED ORDER — TOPIRAMATE 100 MG PO TABS
200.0000 mg | ORAL_TABLET | Freq: Two times a day (BID) | ORAL | Status: DC
Start: 2023-07-12 — End: 2023-07-13
  Filled 2023-07-12 (×2): qty 2

## 2023-07-12 MED ORDER — SODIUM CHLORIDE 0.9 % IV SOLN: 250.0000 mL | INTRAVENOUS | Status: DC

## 2023-07-12 MED ORDER — DROSPIRENONE-ETHINYL ESTRADIOL 3-0.02 MG PO TABS: 1.0000 | ORAL_TABLET | Freq: Every day | ORAL | Status: DC

## 2023-07-12 MED ORDER — SUMATRIPTAN SUCCINATE 50 MG PO TABS
100.0000 mg | ORAL_TABLET | ORAL | Status: DC | PRN
  Administered 2023-07-12: 100 mg via ORAL
  Filled 2023-07-12: qty 2

## 2023-07-12 MED ORDER — ALBUMIN HUMAN 25 % IV SOLN
INTRAVENOUS | Status: AC
  Filled 2023-07-12: qty 50

## 2023-07-12 MED ORDER — SERTRALINE HCL 50 MG PO TABS
200.0000 mg | ORAL_TABLET | Freq: Every day | ORAL | Status: DC
Start: 2023-07-12 — End: 2023-07-14
  Administered 2023-07-12 – 2023-07-14 (×3): 200 mg via ORAL
  Filled 2023-07-12 (×3): qty 4

## 2023-07-12 MED ORDER — LORAZEPAM 0.5 MG PO TABS: 0.5000 mg | ORAL_TABLET | Freq: Four times a day (QID) | ORAL | Status: DC | PRN

## 2023-07-12 MED ORDER — NOREPINEPHRINE 4 MG/250ML-% IV SOLN
2.0000 ug/min | INTRAVENOUS | Status: DC
  Administered 2023-07-12: 4 ug/min via INTRAVENOUS
  Filled 2023-07-12: qty 250

## 2023-07-12 NOTE — Progress Notes (Signed)
       CROSS COVER NOTE  NAME: Kimberly Villanueva MRN: [id number] DOB : 29-Sep-1990    Date of Service   07/12/2023   HPI/Events of Note   Pt's BP did not improve after fluids, midodrine and hydrocortisone. Admitting physician reccommended to started pressors if BP does not improve after the above interventions.  Interventions   Plan: Peripheral norepinephrine started and pt care level changed to SD.       Ailin Rochford Lamin Geradine Girt, MSN, APRN, AGACNP-BC [facility] [place] Pager: [phone]. Check Amion for Availability

## 2023-07-12 NOTE — ED Notes (Signed)
Pt up to the restroom with the assistance of this RN as a stand-by only. Pt denies any dizziness/lightheadedness. Pt has a steady, even gait and tolerated activity well.

## 2023-07-12 NOTE — Progress Notes (Signed)
       CROSS COVER NOTE  NAME: Monquie Schmeiser MRN: [id number] DOB : 25-Oct-1990 ATTENDING PHYSICIAN: Debarah Crape, DO    Date of Service   07/12/2023   HPI/Events of Note   Message received from RN "[facility] plyenephtitis PTA note says ok to take home topamax but its not on the [facility]- could you add that order please. 200 mg 2x daily" via secure chat   Interventions   Assessment/Plan: Neurologist and admission/progress notes reviewed  Topamax resumed 200 mg BID         Donnie Mesa NP [facility] Cross Cover 7pm-7am - check amion for availability Pager [phone]

## 2023-07-12 NOTE — ED Notes (Signed)
Update NP Jawo about patient's current blood pressure. Systolic number remains in the 80's. Patient is asymptomatic. Awaiting new orders.

## 2023-07-12 NOTE — Progress Notes (Signed)
PROGRESS NOTE    Kimberly Villanueva  WUJ:[id number] DOB: [birth year] DOA: 07/11/2023 PCP: [facility]  Chief Complaint  Patient presents with   Allergic Reaction    Hospital Course:  Kimberly Villanueva is 33 y.o. female with history of kidney stones, asthma, depression, anxiety, seizure disorder, migraines, who presents with dysuria, chills, increased allergic reaction  Patient was recently diagnosed with nonobstructive kidney stone in September 2024.  She has been following up with urologist Dr. Richardo Hanks.  Given new symptoms of UTI including dysuria and positive urinalysis patient was started on Bactrim for pyelonephritis on 1/21.  After initiating the medication she developed an allergic reaction with rash on face, arms, upper trunk, and also endorsed mild shortness of breath which has since resolved.  She took Careers adviser at home.  On arrival to the ED patient is complaining of sharp severe bilateral flank pain associated with nausea.  Blood pressure on arrival 95/59 but continued to drop to 76/57.  She was initiated on midodrine and Solu-Cortef and received 4 L IV fluids.  Blood pressure continued to downtrend.  She was initiated on norepinephrine 18 MAP goals above 65.  Blood pressure responded well and patient was able to be titrated off nor epi by noon on 1/23.  Subjective: On evaluation this morning patient is still very fatigued but is getting to feel better.  She reports her rash is mostly resolved though she has noted some petechiae all over her body. Her boyfriend and boyfriend's mom are at bedside.  We discussed her care plan extensively.   Objective: Vitals:   07/12/23 1415 07/12/23 1430 07/12/23 1445 07/12/23 1537  BP: 92/64 (!) 88/64 92/69   Pulse: 96 97 98 94  Resp: (!) 32 (!) 30 (!) 28 17  Temp:    99 F (37.2 C)  TempSrc:    Oral  SpO2: 99% 99% 99% 97%  Weight:      Height:        Intake/Output Summary (Last 24 hours) at 07/12/2023 1550 Last data filed at  07/12/2023 0748 Gross per 24 hour  Intake 4121.02 ml  Output --  Net 4121.02 ml   Filed Weights   07/11/23 1247  Weight: 72.6 kg    Examination: General exam: Appears calm and comfortable, NAD  Respiratory system: No work of breathing, symmetric chest wall expansion, intermittent tachypnea Cardiovascular system: S1 & S2 heard, RRR.  Gastrointestinal system: Abdomen is nondistended, soft and nontender.  Neuro: Alert and oriented. No focal neurological deficits. Extremities: Symmetric, expected ROM Skin: Small scattered erythematous patches on trunk, lower extremities, arms, back. See photos. Psychiatry: Mood & affect appropriate for situation.            Assessment & Plan:  Principal Problem:   Acute pyelonephritis Active Problems:   Severe sepsis with septic shock (CODE) (HCC)   Kidney stone   Migraine   Hypokalemia   Asthma   Allergic reaction   Diarrhea   Epilepsy (HCC)   Overweight (BMI 25.0-29.9)    Severe sepsis with septic shock due to acute pyelonephritis in setting of nonobstructive kidney stone - Sepsis criteria: Leukocytosis, tachycardia, tachypnea, lactic acidosis, hypotension. - Status post 4 L fluid bolus, Solu-Cortef, albumin, midodrine, Levophed.  Patient required roughly 12 hours of Levophed and then this was able to be titrated off.  She was initially planned for ICU but is still boarding in the ER.  Now that she is no longer requiring Levophed we will plan for medical telemetry unit. -  Maintain MAP goal above 65, wean midodrine as tolerated. - Outpatient urine cultures revealed E. coli with some resistance though sensitive to ceftriaxone which patient is currently on.  Will continue with ceftriaxone for now and eventually transition to cefpodoxime when more stable - Blood cultures negative to date - Continue telemetry - Continue to monitor daily CBC and fever curve  Nonobstructing calculi - CT reveals 3 nonobstructing calculi in the left kidney  with largest measuring up to 5 mm, no nephroureterolithiasis on either side - No obstructive uropathy -- Follow with Uro outpatient  Hyperglycemia - Complicated by acute infection - Hemoglobin A1c ordered to better eval  Depression Anxiety - Continue home dose Zoloft  Allergic reaction - Patient experienced rash with Bactrim.  Bactrim has since been discontinued and added to allergy list. - Rash is resolving - As needed Benadryl for itching  Migraine history - Continue PRN triptan  Hypokalemia Hypomagnesemia - Replace as needed  Asthma, not currently in exacerbation - Continue Singulair - As needed albuterol and Mucinex  Diarrhea -x 1, now resolved  Epilepsy - Patient is currently taking Topamax, they are considering changing this medication outpatient.  Sees her neurologist on 1/27 - Continue with Topamax home dose for now - Continue Ativan as needed for seizure  On oral contraceptive pills - Continue home dose  Overweight BMI 26 - Outpatient follow up for lifestyle modification and risk factor management    DVT prophylaxis: Heparin   Code Status: Full Code Family Communication: Discussed with patient as well as with boyfriend and boyfriend's family at bedside. Disposition:  Status is: Inpatient     Consultants:      Procedures:    Antimicrobials:  Anti-infectives (From admission, onward)    Start     Dose/Rate Route Frequency Ordered Stop   07/12/23 1900  cefTRIAXone (ROCEPHIN) 2 g in sodium chloride 0.9 % 100 mL IVPB        2 g 200 mL/hr over 30 Minutes Intravenous Every 24 hours 07/11/23 1857     07/11/23 1700  cefTRIAXone (ROCEPHIN) 2 g in sodium chloride 0.9 % 100 mL IVPB        2 g 200 mL/hr over 30 Minutes Intravenous  Once 07/11/23 1655 07/11/23 1736       Data Reviewed: I have personally reviewed following labs and imaging studies CBC: Recent Labs  Lab 07/11/23 1529 07/12/23 0619  WBC 13.5* 10.6*  NEUTROABS 11.6*  --   HGB 14.7  12.4  HCT 42.5 36.7  MCV 89.5 92.4  PLT 237 175   Basic Metabolic Panel: Recent Labs  Lab 07/11/23 1529 07/12/23 0619  NA 133* 138  K 3.4* 3.3*  CL 106 115*  CO2 17* 15*  GLUCOSE 111* 172*  BUN 11 7  CREATININE 0.87 0.75  CALCIUM 8.8* 7.1*  MG 1.8  --    GFR: Estimated Creatinine Clearance: 99.8 mL/min (by C-G formula based on SCr of 0.75 mg/dL). Liver Function Tests: Recent Labs  Lab 07/11/23 1529  AST 22  ALT 19  ALKPHOS 53  BILITOT 0.6  PROT 6.9  ALBUMIN 3.8   CBG: No results for input(s): "GLUCAP" in the last 168 hours.  Recent Results (from the past 240 hours)  Urine Culture     Status: Abnormal   Collection Time: 07/10/23  9:51 AM   Specimen: Urine, Clean Catch  Result Value Ref Range Status   Specimen Description   Final    URINE, CLEAN CATCH Performed at [facility], [address] [place], [place] [address]    Special Requests   Final    NONE Performed at [facility], [address], [place], [place] [address]    Culture >=100,000 COLONIES/mL ESCHERICHIA COLI (A)  Final   Report Status 07/12/2023 FINAL  Final   Organism ID, Bacteria ESCHERICHIA COLI (A)  Final      Susceptibility   Escherichia coli - MIC*    AMPICILLIN >=32 RESISTANT Resistant     CEFAZOLIN 16 SENSITIVE Sensitive     CEFEPIME <=0.12 SENSITIVE Sensitive     CEFTRIAXONE <=0.25 SENSITIVE Sensitive     CIPROFLOXACIN <=0.25 SENSITIVE Sensitive     GENTAMICIN >=16 RESISTANT Resistant     IMIPENEM <=0.25 SENSITIVE Sensitive     NITROFURANTOIN <=16 SENSITIVE Sensitive     TRIMETH/SULFA >=320 RESISTANT Resistant     AMPICILLIN/SULBACTAM >=32 RESISTANT Resistant     PIP/TAZO <=4 SENSITIVE Sensitive ug/mL    * >=100,000 COLONIES/mL ESCHERICHIA COLI  Blood culture (routine x 2)     Status: None (Preliminary result)   Collection Time: 07/11/23  6:52 PM   Specimen: BLOOD  Result Value Ref Range Status   Specimen Description BLOOD BLOOD LEFT ARM  Final    Special Requests   Final    BOTTLES DRAWN AEROBIC AND ANAEROBIC Blood Culture adequate volume   Culture   Final    NO GROWTH < 12 HOURS Performed at [facility], [address] [place], [place] [address]    Report Status PENDING  Incomplete  Blood culture (routine x 2)     Status: None (Preliminary result)   Collection Time: 07/11/23  6:52 PM   Specimen: BLOOD  Result Value Ref Range Status   Specimen Description BLOOD BLOOD RIGHT ARM  Final   Special Requests   Final    BOTTLES DRAWN AEROBIC AND ANAEROBIC Blood Culture adequate volume   Culture   Final    NO GROWTH < 12 HOURS Performed at [facility], [address] [place], [place] [address]    Report Status PENDING  Incomplete     Radiology Studies: CT Renal Stone Study Result Date: 07/11/2023 CLINICAL DATA:  Left flank pain.  History of renal stones. EXAM: CT ABDOMEN AND PELVIS WITHOUT CONTRAST TECHNIQUE: Multidetector CT imaging of the abdomen and pelvis was performed following the standard protocol without IV contrast. RADIATION DOSE REDUCTION: This exam was performed according to the departmental dose-optimization program which includes automated exposure control, adjustment of the mA and/or kV according to patient size and/or use of iterative reconstruction technique. COMPARISON:  CT scan renal stone protocol from 03/12/2023. FINDINGS: Lower chest: The lung bases are clear. No pleural effusion. The heart is normal in size. No pericardial effusion. Hepatobiliary: The liver is normal in size. Non-cirrhotic configuration. No suspicious mass. No intrahepatic or extrahepatic bile duct dilation. No calcified gallstones. Normal gallbladder wall thickness. No pericholecystic inflammatory changes. Pancreas: Unremarkable. No pancreatic ductal dilatation or surrounding inflammatory changes. Spleen: Within normal limits. No focal lesion. Adrenals/Urinary Tract: Adrenal glands are unremarkable. No suspicious renal mass  within the limitations of this unenhanced exam. There are at least 3, nonobstructing calculi in the left kidney with largest measuring up to 5 mm. No other nephroureterolithiasis on either side. No obstructive uropathy. Urinary bladder is under distended, precluding optimal assessment. However, no large mass or stones identified. No perivesical fat stranding. Stomach/Bowel: No disproportionate dilation of the small or large bowel loops. No evidence of abnormal  bowel wall thickening or inflammatory changes. The appendix is unremarkable. Vascular/Lymphatic: No ascites or pneumoperitoneum. No abdominal or pelvic lymphadenopathy, by size criteria. No aneurysmal dilation of the major abdominal arteries. Reproductive: The uterus is unremarkable. No large adnexal mass. Other: There is a tiny fat containing umbilical hernia. The soft tissues and abdominal wall are otherwise unremarkable. Musculoskeletal: No suspicious osseous lesions. IMPRESSION: 1. There are at least 3, nonobstructing calculi in the left kidney with largest measuring up to 5 mm. No other nephroureterolithiasis or obstructive uropathy on either side. 2. Multiple other nonacute observations, as described above. Electronically Signed   By: Jules Schick M.D.   On: 07/11/2023 16:25    Scheduled Meds:  drospirenone-ethinyl estradiol  1 tablet Oral Daily   heparin  5,000 Units Subcutaneous Q8H   midodrine  10 mg Oral TID WC   montelukast  10 mg Oral QHS   sertraline  200 mg Oral Daily   Continuous Infusions:  sodium chloride Stopped (07/12/23 1052)   sodium chloride Stopped (07/12/23 0535)   cefTRIAXone (ROCEPHIN)  IV     norepinephrine (LEVOPHED) Adult infusion Stopped (07/12/23 1215)     LOS: 1 day    Time spent total:   Debarah Crape, DO [facility]  To contact the attending physician between 7A-7P please use Epic Chat. To contact the covering physician during after hours 7P-7A, please review Amion.   07/12/2023, 3:50  PM   *This document has been created with the assistance of dictation software. Please excuse typographical errors. *

## 2023-07-12 NOTE — ED Notes (Signed)
Pt asked this RN if Ativan would interact with the medications just given.  Pt informed the Ativan would increase the sedative effect of the narcotic.  Pt and mother report she has not had a seizure in several years, but takes Ativan when she starts "feeling odd."  Pt's mother reported they have her ativan and would give it if needed.  This RN requested the Pt not take any of her home medications w/o staff's permission.  This Clinical research associate confirmed and informed the Pt she has IV and PO Ativan in her PRN list.

## 2023-07-13 DIAGNOSIS — N10 Acute tubulo-interstitial nephritis: Secondary | ICD-10-CM | POA: Diagnosis not present

## 2023-07-13 LAB — CBC WITH DIFFERENTIAL/PLATELET
Abs Immature Granulocytes: 0.04 10*3/uL (ref 0.00–0.07)
Basophils Absolute: 0 10*3/uL (ref 0.0–0.1)
Basophils Relative: 0 %
Eosinophils Absolute: 0.8 10*3/uL — ABNORMAL HIGH (ref 0.0–0.5)
Eosinophils Relative: 12 %
HCT: 33.6 % — ABNORMAL LOW (ref 36.0–46.0)
Hemoglobin: 11.3 g/dL — ABNORMAL LOW (ref 12.0–15.0)
Immature Granulocytes: 1 %
Lymphocytes Relative: 18 %
Lymphs Abs: 1.2 10*3/uL (ref 0.7–4.0)
MCH: 30.9 pg (ref 26.0–34.0)
MCHC: 33.6 g/dL (ref 30.0–36.0)
MCV: 91.8 fL (ref 80.0–100.0)
Monocytes Absolute: 1 10*3/uL (ref 0.1–1.0)
Monocytes Relative: 15 %
Neutro Abs: 3.6 10*3/uL (ref 1.7–7.7)
Neutrophils Relative %: 54 %
Platelets: 153 10*3/uL (ref 150–400)
RBC: 3.66 MIL/uL — ABNORMAL LOW (ref 3.87–5.11)
RDW: 13.2 % (ref 11.5–15.5)
WBC: 6.6 10*3/uL (ref 4.0–10.5)
nRBC: 0 % (ref 0.0–0.2)

## 2023-07-13 LAB — COMPREHENSIVE METABOLIC PANEL
ALT: 19 U/L (ref 0–44)
AST: 18 U/L (ref 15–41)
Albumin: 2.9 g/dL — ABNORMAL LOW (ref 3.5–5.0)
Alkaline Phosphatase: 37 U/L — ABNORMAL LOW (ref 38–126)
Anion gap: 7 (ref 5–15)
BUN: 6 mg/dL (ref 6–20)
CO2: 19 mmol/L — ABNORMAL LOW (ref 22–32)
Calcium: 8.1 mg/dL — ABNORMAL LOW (ref 8.9–10.3)
Chloride: 115 mmol/L — ABNORMAL HIGH (ref 98–111)
Creatinine, Ser: 0.64 mg/dL (ref 0.44–1.00)
GFR, Estimated: >60 mL/min (ref >60)
Glucose, Bld: 99 mg/dL (ref 70–99)
Potassium: 3.4 mmol/L — ABNORMAL LOW (ref 3.5–5.1)
Sodium: 141 mmol/L (ref 135–145)
Total Bilirubin: 0.6 mg/dL (ref 0.0–1.2)
Total Protein: 5.4 g/dL — ABNORMAL LOW (ref 6.5–8.1)

## 2023-07-13 LAB — MAGNESIUM: Magnesium: 2 mg/dL (ref 1.7–2.4)

## 2023-07-13 LAB — PHOSPHORUS: Phosphorus: 1.7 mg/dL — ABNORMAL LOW (ref 2.5–4.6)

## 2023-07-13 MED ORDER — TOPIRAMATE 200 MG PO TABS
200.0000 mg | ORAL_TABLET | Freq: Two times a day (BID) | ORAL | Status: DC
  Administered 2023-07-14: 200 mg via ORAL
  Filled 2023-07-13: qty 1

## 2023-07-13 MED ORDER — NON FORMULARY: 5.0000 mg | Freq: Every day | Status: DC | PRN

## 2023-07-13 MED ORDER — MIDODRINE HCL 5 MG PO TABS
2.5000 mg | ORAL_TABLET | Freq: Three times a day (TID) | ORAL | Status: DC
  Administered 2023-07-13: 2.5 mg via ORAL
  Filled 2023-07-13: qty 1

## 2023-07-13 MED ORDER — TOPIRAMATE 100 MG PO TABS
200.0000 mg | ORAL_TABLET | Freq: Two times a day (BID) | ORAL | Status: DC
  Administered 2023-07-13 (×2): 200 mg via ORAL
  Filled 2023-07-13 (×2): qty 2

## 2023-07-13 MED ORDER — ZOLMITRIPTAN 2.5 MG PO TABS
5.0000 mg | ORAL_TABLET | Freq: Every day | ORAL | Status: DC | PRN
  Administered 2023-07-13 – 2023-07-14 (×2): 5 mg via ORAL
  Filled 2023-07-13 (×2): qty 2

## 2023-07-13 NOTE — Plan of Care (Signed)
  Problem: Education: Goal: Knowledge of General Education information will improve Description: Including pain rating scale, medication(s)/side effects and non-pharmacologic comfort measures 07/13/2023 0412 by Gwenlyn Saran, RN Outcome: Progressing 07/13/2023 0412 by Gwenlyn Saran, RN Outcome: Not Progressing   Problem: Health Behavior/Discharge Planning: Goal: Ability to manage health-related needs will improve 07/13/2023 0412 by Gwenlyn Saran, RN Outcome: Progressing 07/13/2023 0412 by Gwenlyn Saran, RN Outcome: Not Progressing   Problem: Clinical Measurements: Goal: Ability to maintain clinical measurements within normal limits will improve 07/13/2023 0412 by Gwenlyn Saran, RN Outcome: Progressing 07/13/2023 0412 by Gwenlyn Saran, RN Outcome: Not Progressing Goal: Will remain free from infection 07/13/2023 0412 by Gwenlyn Saran, RN Outcome: Progressing 07/13/2023 0412 by Gwenlyn Saran, RN Outcome: Not Progressing Goal: Diagnostic test results will improve 07/13/2023 0412 by Gwenlyn Saran, RN Outcome: Progressing 07/13/2023 0412 by Gwenlyn Saran, RN Outcome: Not Progressing Goal: Respiratory complications will improve 07/13/2023 0412 by Gwenlyn Saran, RN Outcome: Progressing 07/13/2023 0412 by Gwenlyn Saran, RN Outcome: Not Progressing Goal: Cardiovascular complication will be avoided 07/13/2023 0412 by Gwenlyn Saran, RN Outcome: Progressing 07/13/2023 0412 by Gwenlyn Saran, RN Outcome: Not Progressing   Problem: Activity: Goal: Risk for activity intolerance will decrease 07/13/2023 0412 by Gwenlyn Saran, RN Outcome: Progressing 07/13/2023 0412 by Gwenlyn Saran, RN Outcome: Not Progressing   Problem: Nutrition: Goal: Adequate nutrition will be maintained 07/13/2023 0412 by Gwenlyn Saran, RN Outcome: Progressing 07/13/2023 0412 by Gwenlyn Saran, RN Outcome: Not Progressing   Problem: Coping: Goal: Level of anxiety will decrease 07/13/2023 0412 by Gwenlyn Saran, RN Outcome: Progressing 07/13/2023 0412 by Gwenlyn Saran, RN Outcome: Not Progressing   Problem: Elimination: Goal: Will not experience complications related to bowel motility 07/13/2023 0412 by Gwenlyn Saran, RN Outcome: Progressing 07/13/2023 0412 by Gwenlyn Saran, RN Outcome: Not Progressing Goal: Will not experience complications related to urinary retention 07/13/2023 0412 by Gwenlyn Saran, RN Outcome: Progressing 07/13/2023 0412 by Gwenlyn Saran, RN Outcome: Not Progressing   Problem: Pain Managment: Goal: General experience of comfort will improve and/or be controlled 07/13/2023 0412 by Gwenlyn Saran, RN Outcome: Progressing 07/13/2023 0412 by Gwenlyn Saran, RN Outcome: Not Progressing   Problem: Safety: Goal: Ability to remain free from injury will improve 07/13/2023 0412 by Gwenlyn Saran, RN Outcome: Progressing 07/13/2023 0412 by Gwenlyn Saran, RN Outcome: Not Progressing   Problem: Skin Integrity: Goal: Risk for impaired skin integrity will decrease 07/13/2023 0412 by Gwenlyn Saran, RN Outcome: Progressing 07/13/2023 0412 by Gwenlyn Saran, RN Outcome: Not Progressing

## 2023-07-13 NOTE — Plan of Care (Signed)

## 2023-07-13 NOTE — Progress Notes (Signed)
PROGRESS NOTE    Kimberly Villanueva  UXL:[id number] DOB: December 22, 1990 DOA: 07/11/2023 PCP: [facility]  Chief Complaint  Patient presents with   Allergic Reaction    Hospital Course:  Kimberly Villanueva is 33 y.o. female with history of kidney stones, asthma, depression, anxiety, seizure disorder, migraines, who presents with dysuria, chills, increased allergic reaction  Patient was recently diagnosed with nonobstructive kidney stone in September 2024.  She has been following up with urologist Dr. Richardo Hanks.  Given new symptoms of UTI including dysuria and positive urinalysis patient was started on Bactrim for pyelonephritis on 1/21.  After initiating the medication she developed an allergic reaction with rash on face, arms, upper trunk, and also endorsed mild shortness of breath which has since resolved.  She took Careers adviser at home.  On arrival to the ED patient is complaining of sharp severe bilateral flank pain associated with nausea.  Blood pressure on arrival 95/59 but continued to drop to 76/57.  She was initiated on midodrine and Solu-Cortef and received 4 L IV fluids.  Blood pressure continued to downtrend.  She was initiated on norepinephrine 18 MAP goals above 65.  Blood pressure responded well and patient was able to be titrated off nor epi by noon on 1/23.  Subjective: No acute events overnight. On evaluation today patient has a migraine.  In-house sumatriptan not effective per her report so we have proceeded with nonformulary home zomig. Otherwise patient is doing well.  Blood pressures have remained stable, no acute events.  Still endorsing some suprapubic tenderness.  Rash is resolving.  Her parents are at bedside.   Objective: Vitals:   07/12/23 1611 07/12/23 2030 07/13/23 0408 07/13/23 0549  BP: (!) 91/59 94/66 (!) 82/58 90/68  Pulse: 80 83 75   Resp: 18 18 18    Temp: 99.7 F (37.6 C) 99.2 F (37.3 C) 98.5 F (36.9 C)   TempSrc: Oral Oral Oral   SpO2: 98% 99% 94%    Weight:      Height:        Intake/Output Summary (Last 24 hours) at 07/13/2023 5366 Last data filed at 07/13/2023 0400 Gross per 24 hour  Intake 1238.29 ml  Output --  Net 1238.29 ml   Filed Weights   07/11/23 1247  Weight: 72.6 kg    Examination: General exam: Appears calm and comfortable, NAD  Respiratory system: No work of breathing, symmetric chest wall expansion, intermittent tachypnea Cardiovascular system: S1 & S2 heard, RRR.  Gastrointestinal system: Abdomen is nondistended, mildly tender to palpation in suprapubic region Neuro: Alert and oriented. No focal neurological deficits. Extremities: Symmetric, expected ROM Skin: Small scattered erythematous patches on trunk, lower extremities, arms, fainter than prior eval Psychiatry: Mood & affect appropriate for situation.    Assessment & Plan:  Principal Problem:   Acute pyelonephritis Active Problems:   Severe sepsis with septic shock (CODE) (HCC)   Kidney stone   Migraine   Hypokalemia   Asthma   Allergic reaction   Diarrhea   Epilepsy (HCC)   Overweight (BMI 25.0-29.9)    Severe sepsis with septic shock due to acute pyelonephritis in setting of nonobstructive kidney stone - Sepsis criteria: Leukocytosis, tachycardia, tachypnea, lactic acidosis, hypotension. - Status post 4 L fluid bolus, Solu-Cortef, albumin, midodrine, Levophed.  Patient required roughly 12 hours of Levophed and then this was able to be titrated off.  She was initially planned for ICU but was still boarding in the ER.  Was able to be weaned off Levophed  prior to floor. - Maintain MAP goal above 65, wean midodrine as tolerated today. - Outpatient urine cultures revealed E. coli with some resistance though sensitive to ceftriaxone which patient is currently on.  Of note, E. coli was resistant to Bactrim which patient was prescribed.  Have discussed bacterial resistance with the patient and her family.  Will continue with ceftriaxone for now and  eventually transition to cefpodoxime tomorrow. - Blood cultures negative to date - Continue telemetry - Continue to monitor daily CBC and fever curve  Nonobstructing calculi - CT reveals 3 nonobstructing calculi in the left kidney with largest measuring up to 5 mm, no nephroureterolithiasis on either side - No obstructive uropathy -- Follow with Uro outpatient  Hyperglycemia - Complicated by acute infection - Hemoglobin A1c 4.9%, no evidence of diabetes.  Depression Anxiety - Continue home dose Zoloft  Allergic reaction - Patient experienced rash with Bactrim.  Bactrim has since been discontinued and added to allergy list. - Rash is resolving - As needed Benadryl for itching  Migraine history - Continue PRN zomig (non formulary)  Hypokalemia Hypomagnesemia - Replace as needed  Asthma, not currently in exacerbation - Continue Singulair - As needed albuterol and Mucinex  Diarrhea -x 1, now resolved  Epilepsy - Patient is currently taking Topamax, they are considering changing this medication outpatient.  Sees her neurologist on 1/27 - Continue with Topamax home dose for now - Continue Ativan as needed for seizure  On oral contraceptive pills - Currently on "placebo week" so will not provide YAZ while hospitalized.  Likely discharge home tomorrow  Overweight BMI 26 - Outpatient follow up for lifestyle modification and risk factor management  Normocytic anemia - Suspect this is hemodilutional, no occult blood loss.  Has received significant IV fluids since arrival. - Follow-up with PCP in 2 weeks for repeat CBC  Hypoalbuminemia Hypophosphatemia - Repeat CMP in a.m. - Suspect electrolyte abnormality secondary to septic shock, anticipate they will self resolve  DVT prophylaxis: Heparin   Code Status: Full Code Family Communication: Discussed with patient as well as parents at bedside Disposition:  Status is: Inpatient     Consultants:      Procedures:     Antimicrobials:  Anti-infectives (From admission, onward)    Start     Dose/Rate Route Frequency Ordered Stop   07/12/23 1900  cefTRIAXone (ROCEPHIN) 2 g in sodium chloride 0.9 % 100 mL IVPB        2 g 200 mL/hr over 30 Minutes Intravenous Every 24 hours 07/11/23 1857     07/11/23 1700  cefTRIAXone (ROCEPHIN) 2 g in sodium chloride 0.9 % 100 mL IVPB        2 g 200 mL/hr over 30 Minutes Intravenous  Once 07/11/23 1655 07/11/23 1736       Data Reviewed: I have personally reviewed following labs and imaging studies CBC: Recent Labs  Lab 07/11/23 1529 07/12/23 0619 07/13/23 0554  WBC 13.5* 10.6* 6.6  NEUTROABS 11.6*  --  3.6  HGB 14.7 12.4 11.3*  HCT 42.5 36.7 33.6*  MCV 89.5 92.4 91.8  PLT 237 175 153   Basic Metabolic Panel: Recent Labs  Lab 07/11/23 1529 07/12/23 0619 07/13/23 0554  NA 133* 138 141  K 3.4* 3.3* 3.4*  CL 106 115* 115*  CO2 17* 15* 19*  GLUCOSE 111* 172* 99  BUN 11 7 6   CREATININE 0.87 0.75 0.64  CALCIUM 8.8* 7.1* 8.1*  MG 1.8  --  2.0  PHOS  --   --  1.7*   GFR: Estimated Creatinine Clearance: 99.8 mL/min (by C-G formula based on SCr of 0.64 mg/dL). Liver Function Tests: Recent Labs  Lab 07/11/23 1529 07/13/23 0554  AST 22 18  ALT 19 19  ALKPHOS 53 37*  BILITOT 0.6 0.6  PROT 6.9 5.4*  ALBUMIN 3.8 2.9*   CBG: No results for input(s): "GLUCAP" in the last 168 hours.  Recent Results (from the past 240 hours)  Urine Culture     Status: Abnormal   Collection Time: 07/10/23  9:51 AM   Specimen: Urine, Clean Catch  Result Value Ref Range Status   Specimen Description   Final    URINE, CLEAN CATCH Performed at [facility], [address], [place], [place] [address]    Special Requests   Final    NONE Performed at [facility], [address], [place], [place] [address]    Culture >=100,000 COLONIES/mL ESCHERICHIA COLI (A)  Final   Report Status 07/12/2023 FINAL  Final   Organism ID, Bacteria  ESCHERICHIA COLI (A)  Final      Susceptibility   Escherichia coli - MIC*    AMPICILLIN >=32 RESISTANT Resistant     CEFAZOLIN 16 SENSITIVE Sensitive     CEFEPIME <=0.12 SENSITIVE Sensitive     CEFTRIAXONE <=0.25 SENSITIVE Sensitive     CIPROFLOXACIN <=0.25 SENSITIVE Sensitive     GENTAMICIN >=16 RESISTANT Resistant     IMIPENEM <=0.25 SENSITIVE Sensitive     NITROFURANTOIN <=16 SENSITIVE Sensitive     TRIMETH/SULFA >=320 RESISTANT Resistant     AMPICILLIN/SULBACTAM >=32 RESISTANT Resistant     PIP/TAZO <=4 SENSITIVE Sensitive ug/mL    * >=100,000 COLONIES/mL ESCHERICHIA COLI  Blood culture (routine x 2)     Status: None (Preliminary result)   Collection Time: 07/11/23  6:52 PM   Specimen: BLOOD  Result Value Ref Range Status   Specimen Description BLOOD BLOOD LEFT ARM  Final   Special Requests   Final    BOTTLES DRAWN AEROBIC AND ANAEROBIC Blood Culture adequate volume   Culture   Final    NO GROWTH 2 DAYS Performed at [facility], [address] [place], [place] [address]    Report Status PENDING  Incomplete  Blood culture (routine x 2)     Status: None (Preliminary result)   Collection Time: 07/11/23  6:52 PM   Specimen: BLOOD  Result Value Ref Range Status   Specimen Description BLOOD BLOOD RIGHT ARM  Final   Special Requests   Final    BOTTLES DRAWN AEROBIC AND ANAEROBIC Blood Culture adequate volume   Culture   Final    NO GROWTH 2 DAYS Performed at [facility], [address], [place], [place] [address]    Report Status PENDING  Incomplete     Radiology Studies: CT Renal Stone Study Result Date: 07/11/2023 CLINICAL DATA:  Left flank pain.  History of renal stones. EXAM: CT ABDOMEN AND PELVIS WITHOUT CONTRAST TECHNIQUE: Multidetector CT imaging of the abdomen and pelvis was performed following the standard protocol without IV contrast. RADIATION DOSE REDUCTION: This exam was performed according to the departmental dose-optimization program which  includes automated exposure control, adjustment of the mA and/or kV according to patient size and/or use of iterative reconstruction technique. COMPARISON:  CT scan renal stone protocol from 03/12/2023. FINDINGS: Lower chest: The lung bases are clear. No pleural effusion. The heart is normal in size. No pericardial effusion. Hepatobiliary: The liver is normal in size.  Non-cirrhotic configuration. No suspicious mass. No intrahepatic or extrahepatic bile duct dilation. No calcified gallstones. Normal gallbladder wall thickness. No pericholecystic inflammatory changes. Pancreas: Unremarkable. No pancreatic ductal dilatation or surrounding inflammatory changes. Spleen: Within normal limits. No focal lesion. Adrenals/Urinary Tract: Adrenal glands are unremarkable. No suspicious renal mass within the limitations of this unenhanced exam. There are at least 3, nonobstructing calculi in the left kidney with largest measuring up to 5 mm. No other nephroureterolithiasis on either side. No obstructive uropathy. Urinary bladder is under distended, precluding optimal assessment. However, no large mass or stones identified. No perivesical fat stranding. Stomach/Bowel: No disproportionate dilation of the small or large bowel loops. No evidence of abnormal bowel wall thickening or inflammatory changes. The appendix is unremarkable. Vascular/Lymphatic: No ascites or pneumoperitoneum. No abdominal or pelvic lymphadenopathy, by size criteria. No aneurysmal dilation of the major abdominal arteries. Reproductive: The uterus is unremarkable. No large adnexal mass. Other: There is a tiny fat containing umbilical hernia. The soft tissues and abdominal wall are otherwise unremarkable. Musculoskeletal: No suspicious osseous lesions. IMPRESSION: 1. There are at least 3, nonobstructing calculi in the left kidney with largest measuring up to 5 mm. No other nephroureterolithiasis or obstructive uropathy on either side. 2. Multiple other nonacute  observations, as described above. Electronically Signed   By: Jules Schick M.D.   On: 07/11/2023 16:25    Scheduled Meds:  drospirenone-ethinyl estradiol  1 tablet Oral Daily   heparin  5,000 Units Subcutaneous Q8H   midodrine  10 mg Oral TID WC   montelukast  10 mg Oral QHS   sertraline  200 mg Oral Daily   topiramate  200 mg Oral Q12H   Continuous Infusions:  sodium chloride Stopped (07/12/23 1052)   cefTRIAXone (ROCEPHIN)  IV 2 g (07/12/23 2123)   norepinephrine (LEVOPHED) Adult infusion Stopped (07/12/23 1215)     LOS: 2 days    Time spent total:   Debarah Crape, DO [facility]  To contact the attending physician between 7A-7P please use Epic Chat. To contact the covering physician during after hours 7P-7A, please review Amion.   07/13/2023, 8:08 AM   *This document has been created with the assistance of dictation software. Please excuse typographical errors. *

## 2023-07-14 DIAGNOSIS — N10 Acute tubulo-interstitial nephritis: Secondary | ICD-10-CM | POA: Diagnosis not present

## 2023-07-14 LAB — CBC WITH DIFFERENTIAL/PLATELET
Abs Immature Granulocytes: 0.03 10*3/uL (ref 0.00–0.07)
Basophils Absolute: 0 10*3/uL (ref 0.0–0.1)
Basophils Relative: 0 %
Eosinophils Absolute: 1 10*3/uL — ABNORMAL HIGH (ref 0.0–0.5)
Eosinophils Relative: 13 %
HCT: 34 % — ABNORMAL LOW (ref 36.0–46.0)
Hemoglobin: 11.5 g/dL — ABNORMAL LOW (ref 12.0–15.0)
Immature Granulocytes: 0 %
Lymphocytes Relative: 30 %
Lymphs Abs: 2.3 10*3/uL (ref 0.7–4.0)
MCH: 30.9 pg (ref 26.0–34.0)
MCHC: 33.8 g/dL (ref 30.0–36.0)
MCV: 91.4 fL (ref 80.0–100.0)
Monocytes Absolute: 1.2 10*3/uL — ABNORMAL HIGH (ref 0.1–1.0)
Monocytes Relative: 16 %
Neutro Abs: 3.2 10*3/uL (ref 1.7–7.7)
Neutrophils Relative %: 41 %
Platelets: 184 10*3/uL (ref 150–400)
RBC: 3.72 MIL/uL — ABNORMAL LOW (ref 3.87–5.11)
RDW: 13.5 % (ref 11.5–15.5)
WBC: 7.7 10*3/uL (ref 4.0–10.5)
nRBC: 0 % (ref 0.0–0.2)

## 2023-07-14 LAB — COMPREHENSIVE METABOLIC PANEL
ALT: 26 U/L (ref 0–44)
AST: 22 U/L (ref 15–41)
Albumin: 3 g/dL — ABNORMAL LOW (ref 3.5–5.0)
Alkaline Phosphatase: 50 U/L (ref 38–126)
Anion gap: 10 (ref 5–15)
BUN: 11 mg/dL (ref 6–20)
CO2: 16 mmol/L — ABNORMAL LOW (ref 22–32)
Calcium: 8.2 mg/dL — ABNORMAL LOW (ref 8.9–10.3)
Chloride: 112 mmol/L — ABNORMAL HIGH (ref 98–111)
Creatinine, Ser: 0.66 mg/dL (ref 0.44–1.00)
GFR, Estimated: >60 mL/min (ref >60)
Glucose, Bld: 89 mg/dL (ref 70–99)
Potassium: 3.3 mmol/L — ABNORMAL LOW (ref 3.5–5.1)
Sodium: 138 mmol/L (ref 135–145)
Total Bilirubin: 0.5 mg/dL (ref 0.0–1.2)
Total Protein: 5.7 g/dL — ABNORMAL LOW (ref 6.5–8.1)

## 2023-07-14 LAB — PHOSPHORUS: Phosphorus: 2.5 mg/dL (ref 2.5–4.6)

## 2023-07-14 LAB — MAGNESIUM: Magnesium: 2.2 mg/dL (ref 1.7–2.4)

## 2023-07-14 MED ORDER — POTASSIUM CHLORIDE CRYS ER 20 MEQ PO TBCR
40.0000 meq | EXTENDED_RELEASE_TABLET | Freq: Once | ORAL | Status: AC
  Administered 2023-07-14: 40 meq via ORAL
  Filled 2023-07-14: qty 2

## 2023-07-14 MED ORDER — KETOROLAC TROMETHAMINE 15 MG/ML IJ SOLN
15.0000 mg | Freq: Once | INTRAMUSCULAR | Status: AC
  Administered 2023-07-14: 15 mg via INTRAVENOUS
  Filled 2023-07-14: qty 1

## 2023-07-14 MED ORDER — CEFPODOXIME PROXETIL 200 MG PO TABS: 200.0000 mg | ORAL_TABLET | Freq: Two times a day (BID) | ORAL | 0 refills | Status: AC

## 2023-07-14 MED ORDER — METOCLOPRAMIDE HCL 5 MG/ML IJ SOLN
5.0000 mg | Freq: Once | INTRAMUSCULAR | Status: AC
  Administered 2023-07-14: 5 mg via INTRAVENOUS
  Filled 2023-07-14: qty 2

## 2023-07-14 NOTE — Discharge Summary (Signed)
Physician Discharge Summary   Patient: Kimberly Villanueva MRN: [id number] DOB: September 16, 1990  Admit date:     07/11/2023  Discharge date: 07/14/23  Discharge Physician: Debarah Crape   PCP: [facility]   Recommendations at discharge:    Follow up with Urology, Neurology, PCP  Discharge Diagnoses: Principal Problem:   Acute pyelonephritis Active Problems:   Severe sepsis with septic shock (CODE) (HCC)   Kidney stone   Migraine   Hypokalemia   Asthma   Allergic reaction   Diarrhea   Epilepsy (HCC)   Overweight (BMI 25.0-29.9)  Resolved Problems:   * No resolved hospital problems. *  Hospital Course: Kimberly Villanueva is a 33 year old female with history of kidney stones, asthma, depression, anxiety, seizure disorder, migraines, who presents with dysuria, chills, and recent allergic reaction.  Patient was diagnosed with nonobstructive kidney stones in September 2024.  She has been following with outpatient urology.  She began experiencing UTI symptoms and was started on Bactrim on 1/21.  After initiating the medication she developed rash on face, arms, upper trunk as well as mild shortness of breath.  She discontinued the medication and it was resolving with antihistamines at home.  When she presented to the ED she was complaining of severe bilateral flank pain and nausea.  She was found to be hypotensive, and initiated on midodrine, Solu-Cortef, and 4 L IV fluids.  Despite these interventions blood pressure continue to downtrend.  She was initiated on norepinephrine and initially admitted to the ICU for treatment of septic shock.  After 12 hours of norepinephrine patient was able to be weaned off of the pressors and ultimately never required ICU stay.  CT scan revealed 3 nonobstructing calculi in the left kidney with the largest measuring up to 5 mm, no nephroureterolithiasis on either side and no evidence of obstructive uropathy.  While on the medical floor patient responded  well to antibiotics.  Outpatient urine culture resulted positive for E. coli with resistance to Bactrim. It did confirm sensitivity to ceftriaxone and patient had excellent response.  Labs normalized throughout her stay and she was able to be weaned off of all blood pressure support.  By evaluation on 1/25 patient was mostly back to baseline, and MAP remained above 65 without medical assistance for the last 12 hours.  She will need to complete an additional 7 days of cefpodoxime to complete treatment for pyelonephritis.  Of note, patient does have listed allergy to amoxicillin but had no issues with ceftriaxone while admitted.  We have added Bactrim to her allergy list during this admission given allergic reaction prior to arrival.  Stay was briefly complicated by migraine, for which patient took home dose Zomig with improvement.She follows with Neurology outpatient.       Consultants: none Procedures performed: none  Disposition: Home Diet recommendation:  Discharge Diet Orders (From admission, onward)     Start     Ordered   07/14/23 0000  Diet general        07/14/23 1324           Regular diet DISCHARGE MEDICATION: Allergies as of 07/14/2023       Reactions   Amoxicillin Other (See Comments)   Shortness of breath, palpitations as infanct Other Reaction(s): Other (See Comments) Shortness of breath, palpitations as infant   Codeine Palpitations   Lamotrigine Other (See Comments)   hallucinations Other Reaction(s): Hallucination   Levetiracetam Other (See Comments)   hallucinations Other Reaction(s): Hallucination   Topiramate Other (See  Comments)   Depression/suicidal  Generic only causes side effects Other Reaction(s): Hallucination   Bactrim [sulfamethoxazole-trimethoprim] Rash        Medication List     STOP taking these medications    ondansetron 4 MG disintegrating tablet Commonly known as: ZOFRAN-ODT       TAKE these medications    albuterol 108 (90  Base) MCG/ACT inhaler Commonly known as: VENTOLIN HFA Inhale 1-2 puffs into the lungs every 6 (six) hours as needed for wheezing or shortness of breath.   cefpodoxime 200 MG tablet Commonly known as: VANTIN Take 1 tablet (200 mg total) by mouth 2 (two) times daily for 7 days.   drospirenone-ethinyl estradiol 3-0.02 MG tablet Commonly known as: YAZ Take 1 tablet by mouth daily.   fexofenadine 180 MG tablet Commonly known as: ALLEGRA Take 180 mg by mouth daily.   fluconazole 150 MG tablet Commonly known as: DIFLUCAN Take 150 mg by mouth once.   LORazepam 0.5 MG tablet Commonly known as: ATIVAN as needed.   montelukast 10 MG tablet Commonly known as: SINGULAIR Take by mouth.   sertraline 100 MG tablet Commonly known as: ZOLOFT Take 200 mg by mouth daily.   topiramate 200 MG tablet Commonly known as: TOPAMAX Take 1 tablet by mouth 2 (two) times daily.   zolmitriptan 5 MG disintegrating tablet Commonly known as: ZOMIG-ZMT Take 5 mg by mouth as needed.        Discharge Exam: Filed Weights   07/11/23 1247  Weight: 72.6 kg   Constitutional:  Normal appearance. Non toxic-appearing.  HENT: Head Normocephalic and atraumatic.  Mucous membranes are moist.  Eyes:  Extraocular intact. Conjunctivae normal. Pupils are equal, round, and reactive to light.  Cardiovascular: Rate and Rhythm: Normal rate and regular rhythm.  Pulmonary: Non labored, symmetric rise of chest wall.  Musculoskeletal:  Normal range of motion.  Skin: warm and dry. not jaundiced.  Neurological: No focal deficit present. alert. Oriented. Psychiatric: Mood and Affect congruent.    Condition at discharge: good  The results of significant diagnostics from this hospitalization (including imaging, microbiology, ancillary and laboratory) are listed below for reference.   Imaging Studies: Abdomen 1 view (KUB) Result Date: 07/11/2023 CLINICAL DATA:  hx of kidney stones EXAM: ABDOMEN - 1 VIEW COMPARISON:   CT scan renal stone protocol from 03/12/2023. FINDINGS: There are at least 2, sub 5 mm calcifications overlying the left renal shadow. No other abnormal calcification noted overlying bilateral kidneys, ureters and urinary bladder region. The bowel gas pattern is non-obstructive. There is moderate stool burden, throughout the colon, compatible with colonic hypomotility. No evidence of pneumoperitoneum. No acute osseous abnormalities. The soft tissues are within normal limits. Surgical changes, devices, tubes and lines: None. Other findings: None. IMPRESSION: *There are at least 2, sub 5 mm calcifications overlying the left renal shadow. *Constipation. Electronically Signed   By: Jules Schick M.D.   On: 07/11/2023 16:26   CT Renal Stone Study Result Date: 07/11/2023 CLINICAL DATA:  Left flank pain.  History of renal stones. EXAM: CT ABDOMEN AND PELVIS WITHOUT CONTRAST TECHNIQUE: Multidetector CT imaging of the abdomen and pelvis was performed following the standard protocol without IV contrast. RADIATION DOSE REDUCTION: This exam was performed according to the departmental dose-optimization program which includes automated exposure control, adjustment of the mA and/or kV according to patient size and/or use of iterative reconstruction technique. COMPARISON:  CT scan renal stone protocol from 03/12/2023. FINDINGS: Lower chest: The lung bases are clear. No pleural effusion.  The heart is normal in size. No pericardial effusion. Hepatobiliary: The liver is normal in size. Non-cirrhotic configuration. No suspicious mass. No intrahepatic or extrahepatic bile duct dilation. No calcified gallstones. Normal gallbladder wall thickness. No pericholecystic inflammatory changes. Pancreas: Unremarkable. No pancreatic ductal dilatation or surrounding inflammatory changes. Spleen: Within normal limits. No focal lesion. Adrenals/Urinary Tract: Adrenal glands are unremarkable. No suspicious renal mass within the limitations of  this unenhanced exam. There are at least 3, nonobstructing calculi in the left kidney with largest measuring up to 5 mm. No other nephroureterolithiasis on either side. No obstructive uropathy. Urinary bladder is under distended, precluding optimal assessment. However, no large mass or stones identified. No perivesical fat stranding. Stomach/Bowel: No disproportionate dilation of the small or large bowel loops. No evidence of abnormal bowel wall thickening or inflammatory changes. The appendix is unremarkable. Vascular/Lymphatic: No ascites or pneumoperitoneum. No abdominal or pelvic lymphadenopathy, by size criteria. No aneurysmal dilation of the major abdominal arteries. Reproductive: The uterus is unremarkable. No large adnexal mass. Other: There is a tiny fat containing umbilical hernia. The soft tissues and abdominal wall are otherwise unremarkable. Musculoskeletal: No suspicious osseous lesions. IMPRESSION: 1. There are at least 3, nonobstructing calculi in the left kidney with largest measuring up to 5 mm. No other nephroureterolithiasis or obstructive uropathy on either side. 2. Multiple other nonacute observations, as described above. Electronically Signed   By: Jules Schick M.D.   On: 07/11/2023 16:25    Microbiology: Results for orders placed or performed during the hospital encounter of 07/11/23  Urine Culture (for pregnant, neutropenic or urologic patients or patients with an indwelling urinary catheter)     Status: Abnormal (Preliminary result)   Collection Time: 07/11/23  3:29 PM   Specimen: Urine, Random  Result Value Ref Range Status   Specimen Description   Final    URINE, RANDOM Performed at [facility], [address] [place], [place] [address]    Special Requests   Final    NONE Performed at [facility], [address] [place], [place] [address]    Culture (A)  Final    >=100,000 COLONIES/mL ESCHERICHIA COLI CULTURE REINCUBATED FOR BETTER  GROWTH SUSCEPTIBILITIES TO FOLLOW Performed at [facility], [address], [place], [place] [address]    Report Status PENDING  Incomplete  Blood culture (routine x 2)     Status: None (Preliminary result)   Collection Time: 07/11/23  6:52 PM   Specimen: BLOOD  Result Value Ref Range Status   Specimen Description BLOOD BLOOD LEFT ARM  Final   Special Requests   Final    BOTTLES DRAWN AEROBIC AND ANAEROBIC Blood Culture adequate volume   Culture   Final    NO GROWTH 3 DAYS Performed at [facility], [address], [place], [place] [address]    Report Status PENDING  Incomplete  Blood culture (routine x 2)     Status: None (Preliminary result)   Collection Time: 07/11/23  6:52 PM   Specimen: BLOOD  Result Value Ref Range Status   Specimen Description BLOOD BLOOD RIGHT ARM  Final   Special Requests   Final    BOTTLES DRAWN AEROBIC AND ANAEROBIC Blood Culture adequate volume   Culture   Final    NO GROWTH 3 DAYS Performed at [facility], [address], [place], [place] [address]    Report Status PENDING  Incomplete    Labs: CBC: Recent Labs  Lab 07/11/23 1529 07/12/23  1610 07/13/23 0554 07/14/23 0427  WBC 13.5* 10.6* 6.6 7.7  NEUTROABS 11.6*  --  3.6 3.2  HGB 14.7 12.4 11.3* 11.5*  HCT 42.5 36.7 33.6* 34.0*  MCV 89.5 92.4 91.8 91.4  PLT 237 175 153 184   Basic Metabolic Panel: Recent Labs  Lab 07/11/23 1529 07/12/23 0619 07/13/23 0554 07/14/23 0427  NA 133* 138 141 138  K 3.4* 3.3* 3.4* 3.3*  CL 106 115* 115* 112*  CO2 17* 15* 19* 16*  GLUCOSE 111* 172* 99 89  BUN 11 7 6 11   CREATININE 0.87 0.75 0.64 0.66  CALCIUM 8.8* 7.1* 8.1* 8.2*  MG 1.8  --  2.0 2.2  PHOS  --   --  1.7* 2.5   Liver Function Tests: Recent Labs  Lab 07/11/23 1529 07/13/23 0554 07/14/23 0427  AST 22 18 22   ALT 19 19 26   ALKPHOS 53 37* 50  BILITOT 0.6 0.6 0.5  PROT 6.9 5.4* 5.7*  ALBUMIN 3.8 2.9* 3.0*   CBG: No results for input(s): "GLUCAP" in  the last 168 hours.  Discharge time spent: greater than 30 minutes.  Signed: Debarah Crape, DO [facility] 07/14/2023

## 2023-07-14 NOTE — Hospital Course (Signed)
Kimberly Villanueva is a 33 year old female with history of kidney stones, asthma, depression, anxiety, seizure disorder, migraines, who presents with dysuria, chills, and recent allergic reaction.  Patient was diagnosed with nonobstructive kidney stones in September 2024.  She has been following with outpatient urology.  She began experiencing UTI symptoms and was started on Bactrim on 1/21.  After initiating the medication she developed rash on face, arms, upper trunk as well as mild shortness of breath.  She discontinued the medication and it was resolving with antihistamines at home.  When she presented to the ED she was complaining of severe bilateral flank pain and nausea.  She was found to be hypotensive, and initiated on midodrine, Solu-Cortef, and 4 L IV fluids.  Despite these interventions blood pressure continue to downtrend.  She was initiated on norepinephrine and initially admitted to the ICU for treatment of septic shock.  After 12 hours of norepinephrine patient was able to be weaned off of the pressors and ultimately never required ICU stay.  CT scan revealed 3 nonobstructing calculi in the left kidney with the largest measuring up to 5 mm, no nephroureterolithiasis on either side and no evidence of obstructive uropathy.  While on the medical floor patient responded well to antibiotics.  Outpatient urine culture resulted positive for E. coli with resistance to Bactrim. It did confirm sensitivity to ceftriaxone and patient had excellent response.  Labs normalized throughout her stay and she was able to be weaned off of all blood pressure support.  By evaluation on 1/25 patient was mostly back to baseline, and MAP remained above 65 without medical assistance for the last 12 hours.  She will need to complete an additional 7 days of cefpodoxime to complete treatment for pyelonephritis.  Of note, patient does have listed allergy to amoxicillin but had no issues with ceftriaxone while admitted.  We have  added Bactrim to her allergy list during this admission given allergic reaction prior to arrival.  Stay was briefly complicated by migraine, for which patient took home dose Zomig with improvement.

## 2023-07-14 NOTE — Plan of Care (Signed)

## 2023-07-14 NOTE — Progress Notes (Signed)
   07/14/23   To Whom it may concern,  Kimberly Villanueva was hospitalized at [facility] from 07/11/2023 through 07/14/23   She has been cleared to return to work on but not before 07/17/23 with no medical restrictions.    Should you have further questions please feel free to contact our office.  Be advised that no medical information will be divulged without a signed HIPAA release from the patient.    Sincerely, Debarah Crape DO [facility]  [phone]

## 2023-07-14 NOTE — Progress Notes (Signed)
Received phone call that patient was unable to pick up entire antibiotic course as she was informed by [facility] that she required prior authorization for cefpodoxime.  Called and discussed directly with [facility].  [facility] reports cefpodoxime appears nonformulary.  [facility] reports the insurance company did not provide an alternative on their formulary.  She was unable to determine if cefdinir would be an acceptable alternative.  Patient has extensive allergy profile and E. coli demonstrates some resistance.  Patient necessitates cephalosporin.  Discussed with [facility].  [facility] reports that they will place override.  Patient was provided first 3 doses of medication.   [facility] reports they will follow-up with the patient directly and offer her to pay cash price, which is less than $30.

## 2023-07-14 NOTE — Progress Notes (Addendum)
0800 patient alert x4 on room air able to make all needs known complains of headache which is chronic home meds given husband at bedside. 1000 patient ambulates in room husbands helps patient with all ADL care needs

## 2023-07-14 NOTE — Progress Notes (Deleted)
   07/14/23   To Whom it may concern,  Kimberly Villanueva was hospitalized at [facility] from 07/11/2023 through 07/14/23   She has been cleared to return to work on but not before 07/09/23 with no medical restrictions.    Should you have further questions please feel free to contact our office.  Be advised that no medical information will be divulged without a signed HIPAA release from the patient.    Sincerely, Debarah Crape DO [facility]  [phone]

## 2023-07-16 ENCOUNTER — Other Ambulatory Visit (HOSPITAL_COMMUNITY): Payer: Self-pay

## 2023-07-16 LAB — URINE CULTURE: Culture: >=100000 — AB

## 2023-07-16 LAB — CULTURE, BLOOD (ROUTINE X 2)
Culture: NO GROWTH
Culture: NO GROWTH
Special Requests: ADEQUATE
Special Requests: ADEQUATE

## 2023-12-26 IMAGING — CR DG CHEST 2V
2 series · 2 of 2 positions shown · non-contrast
Comparison: None.

CLINICAL DATA: cough x 1 month

EXAM:
CHEST - 2 VIEW

[chest pa]
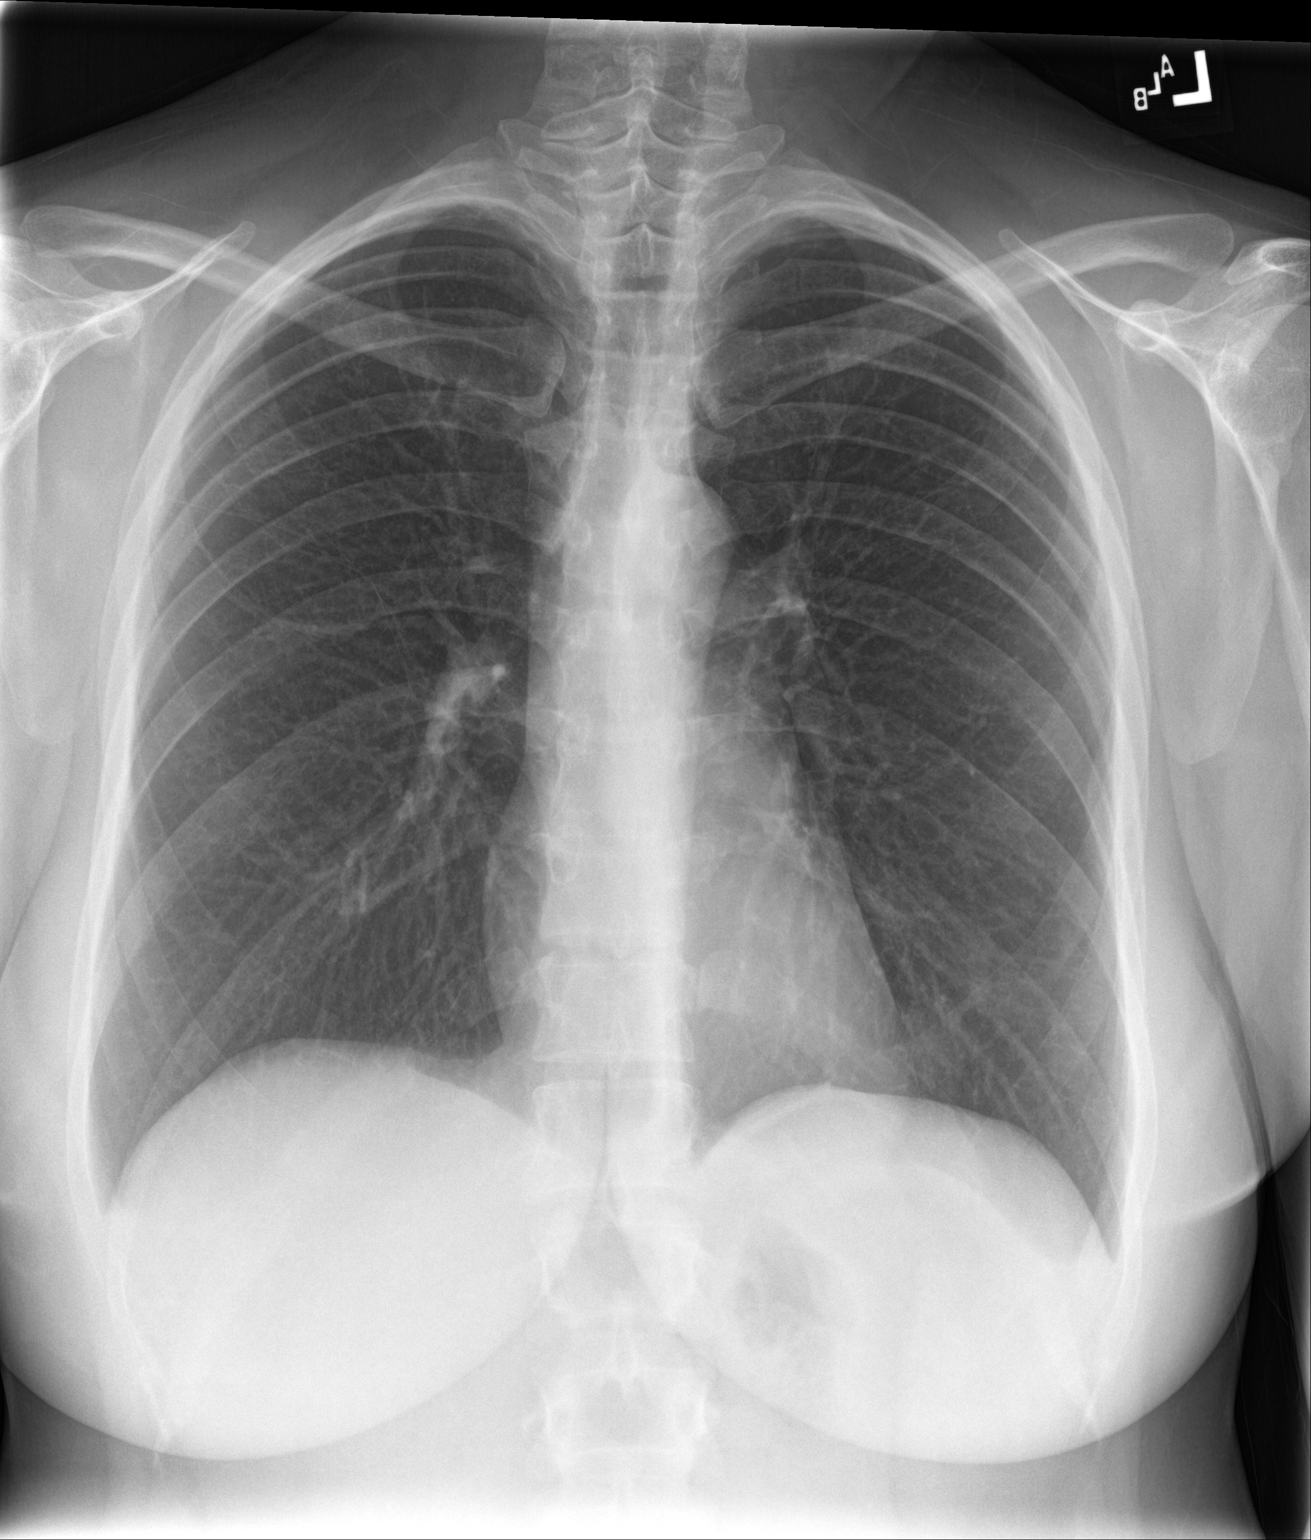

[chest lat]
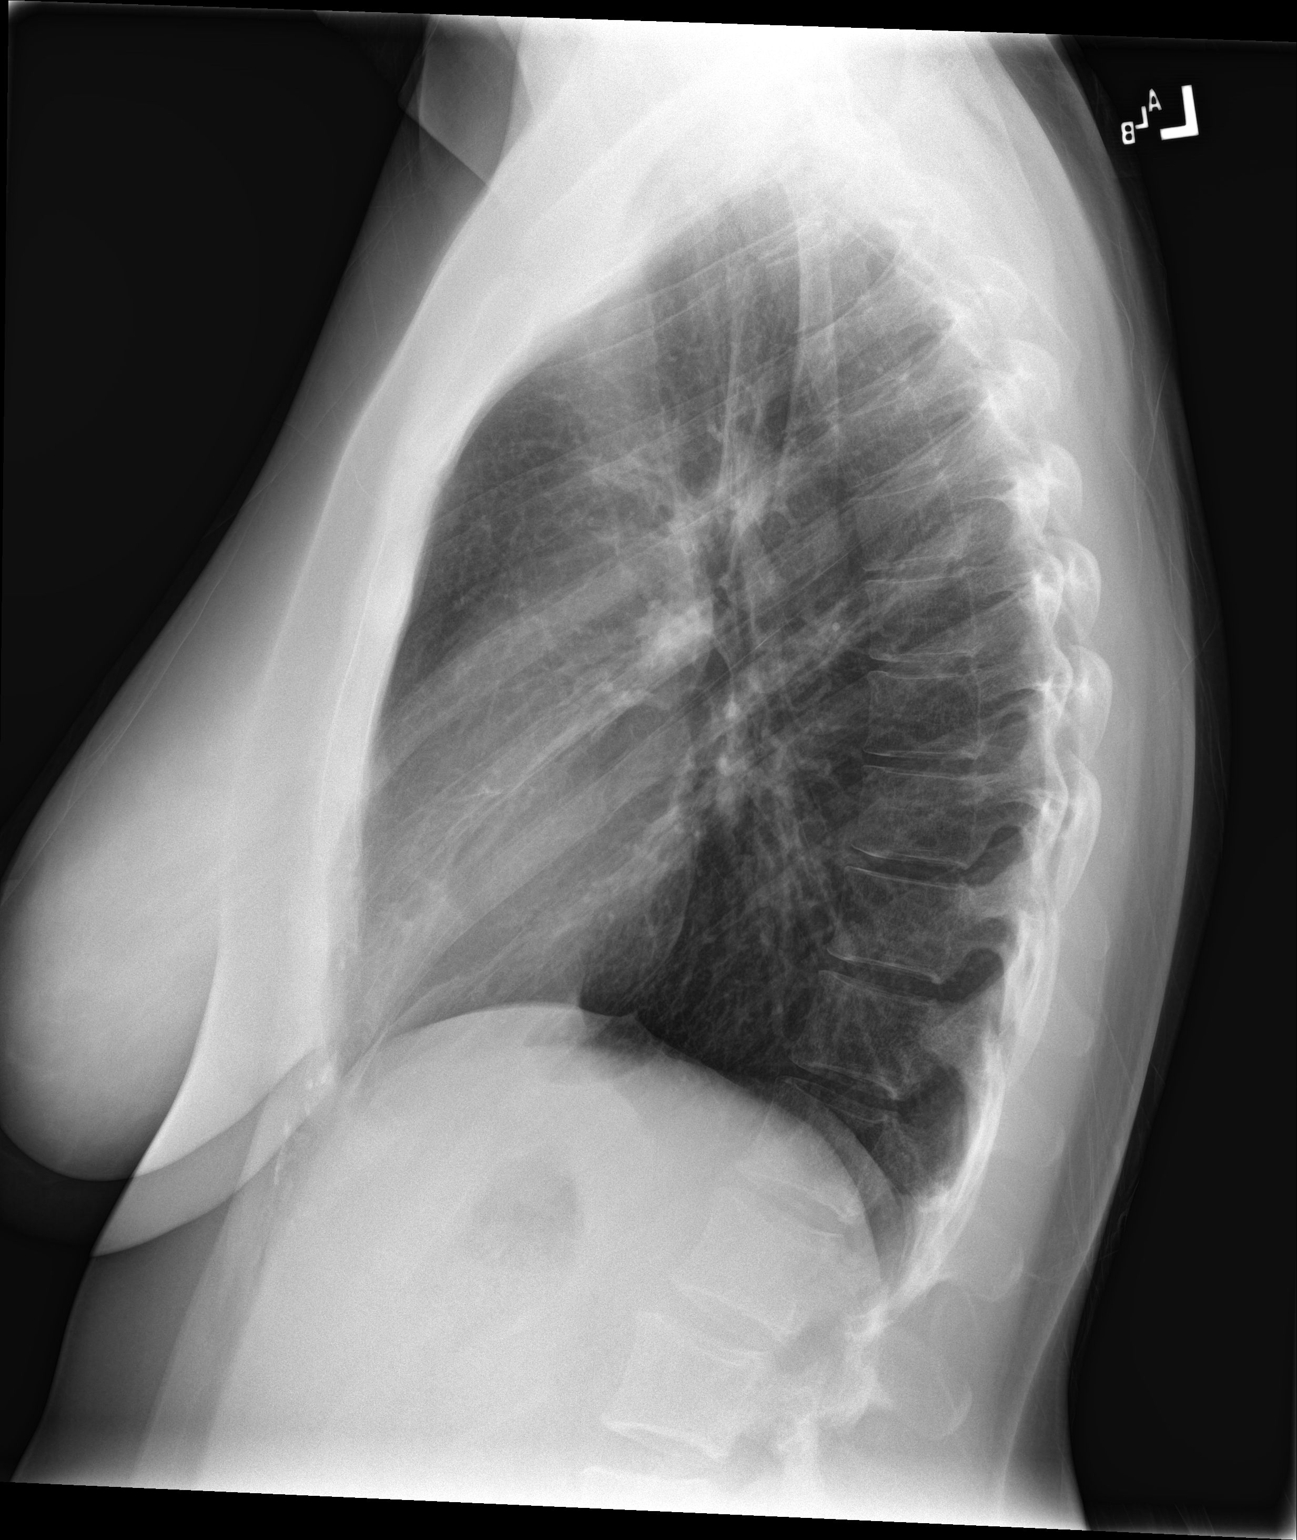

[2 of 2 positions shown; findings below may reference images not displayed]

FINDINGS: The cardiomediastinal silhouette is normal in contour. No pleural
effusion. No pneumothorax. No acute pleuroparenchymal abnormality.
Visualized abdomen is unremarkable. No acute osseous abnormality
noted.
IMPRESSION: No acute cardiopulmonary abnormality.

## 2024-02-13 NOTE — Progress Notes (Signed)
 Prior authorization approved Payer: [employer] [place]  [employer] - [employer] Case ID: [id number]    [phone]    [phone] Note from payer: [place] Case: [id number], Status: Approved, Coverage Starts on: 02/13/2024 12:00:00 AM, Coverage Ends on: 05/13/2024 12:00:00 AM. Approval Details  Authorized from February 13, 2024 to May 13, 2024 Electronic appeal: Not supported  Patient notified via MyChart.

## 2024-02-13 NOTE — Progress Notes (Signed)
 Chief Complaint  Patient presents with  . Seizures    Subjective  Kimberly Villanueva is a 33 y.o. female who presents for Seizures HPI History of Present Illness Kimberly Villanueva is a 33 year old female with migraines and seizure disorder who presents for follow-up.  She experiences increased side effects from lacosamide, currently taking 100 mg in the morning and 150 mg at night. She has 'jerking' movements, particularly when laying down, which were more severe at a higher dose of 150 mg twice a day. The jerking has improved slightly with the dose reduction. She also experiences excessive sweating, which persisted after discontinuation of nortriptyline.  She reports episodes of 'zoning out' that are brief and do not resemble her previous grand mal seizures. These episodes occur without confusion or loss of awareness and are sometimes associated with her menstrual cycle. She has not experienced any grand mal seizures in 13 years. Her mother reports that her seizures previously correlated with her menstrual cycle.  She has a history of migraines, which have been worsening. She uses Zomig  at least twice a week and Excedrin Migraine once a week. She recently started Ajovy for migraine management.  She has gained approximately 15 pounds since discontinuing Topamax  due to kidney stones. Her diet has changed in the last year, and she has been drinking more dark sodas, influenced by her boyfriend.  She reports poor sleep quality, feeling exhausted despite sleeping through the night. She also experiences low back pain that sometimes wraps around to the front, but it does not radiate down her legs.  TODAY   Kimberly Villanueva is a 33 year old female with seizures and migraines who presents for follow-up.  She has been experiencing progressively worsening injection site reactions with Ajovy, characterized by spreading, dark red discoloration, pain, and significant itchiness. Ajovy was the first injectable  tried for her migraines.  She experiences migraines two to three times per week, totaling approximately a dozen over two months while on Ajovy. She continues to use Zomig  for migraine management and requires a refill. No oral symptoms are associated with her migraines.  Regarding her seizure history, she has not had a seizure in thirteen years. She reports occasional muscle twitches but no episodes of zoning out, time lapses, confusion, or loss of awareness since her last visit. She is currently on lacosamide, taking 100 mg in the morning and 150 mg at night, and has discontinued Topamax  due to previous kidney stones.  Her gynecologist recently switched her birth control to Kimberly Villanueva, but she continues to experience excessive sweating.   ADULT EPILEPSY FOLLOW UP REFERRING PROVIDER: Darcus Panning, MD [address] [place],  [place] [address] Date of Service: 09/25/2023 Date of Birth: 06-29-1990 Location: [facility] [address] [place] [address] Dept: [phone] Loc: [phone]  Chief Complaint: Follow-up and Seizures  INTERVAL HISTORY 09/25/2023 History of Present Illness Kimberly Villanueva is a 33 year old female with epilepsy who presents for follow-up.  She was last seen in February when she reported increased migraine frequency and had a recent hospitalization for nephrolithiasis. At that time, a switch from topiramate  to Vimpat (lacosamide) was initiated. Currently, she is on 150 mg of Vimpat twice a day and is in the process of tapering off Topamax  (topiramate ), which she plans to stop in about a week. Her last seizure was in February 2012, and she has been seizure-free since then. No auras or tremors have  been experienced.  Regarding her migraines, she has experienced approximately six migraines since her last visit, which is an improvement from her previous frequency of using nine Zomig  in a month.  She continues to take 10 mg of nortriptyline and is monitoring the impact of tapering off Topamax  on her migraine frequency.  She feels very sleepy, which she attributes to either the Vimpat or nortriptyline, which she is taking at 10 mg. She has reduced the nortriptyline dose due to persistent exhaustion and difficulty keeping her eyes open. She also experiences excessive sweating, which she associates with nortriptyline, similar to her past experience with amitriptyline.   INTERVAL HISTORY 08/06/2023  Kimberly Villanueva is a 33 year old female who presents for evaluation of migraines and epilepsy.  The patient has had an increase in the frequency of her migraines, which she attributes to weather changes. She continues on Topamax  twice daily and Zomig  as needed, which are beneficial in treating her migraines. She does not experience any auras or identifiable symptoms preceding the onset of a migraine, apart from pain. However, she does experience photophobia, phonophobia, and hypersensitivity to touch during her migrainous episodes. The intensity of her migraines has also increased since her last visit. She uses a heating pad on her head at bedtime, which provides some relief. She occasionally takes Tylenol  for her headaches. She does not typically utilize more than 9 Zomig  in 1 month.   Kimberly Villanueva developed nephrolithiasis which led to a urinary tract infection. She subsequently became septic and was hospitalized. During this hospitalization, she experienced a severe migraine for which she was treated with Imitrex , a migraine cocktail twice, and Percocet, none of which provided any relief. She was subsequently given Zomig  which was beneficial for her pain. She had a follow-up with urology in 06/2023 who recommended alternative therapy for her migraines.   She has been seizure-free since 2012 and has been on Topamax  for an extended period. She expresses apprehension about changing her medication.  She has previously tried Keppra and lamotrigine but experienced side effects, including hallucinations and depression.  She is currently on birth control and does not take folic acid. Her sleep pattern typically involves going to bed at 11:00 PM and waking up at 6:30 AM, with sleep latency varying from 10 minutes to 2 hours depending on her level of fatigue.  Her asthma has worsened since her recent sepsis episode.  She is on sertraline  which is helping with her mood.  Supplemental Information She had COVID-19 at Christmas.  Follow up: 03/13/2023 The patient is a 33 year old female who presents via virtual visit for follow-up of migraines and seizure disorder.   She reports no seizures but does have migraines, which she believes are exacerbated by stress and weather changes. She is currently taking Topamax  200 mg twice daily and Zomig  as needed, which she finds helpful in managing her migraines.    About 2 weeks ago, cysts were discovered in her ovaries and cervix. Last Monday, 03/05/2023, she sought urgent care due to lower back pain, which she was informed was typical of cysts. She was advised to visit the ER if the pain intensified. On a Sunday night, she experienced sharp lower back pain that took her breath away, prompting her ER visit. There, she was diagnosed with kidney stones, measuring 5 mm in size. She was sent home to pass the kidney stones and consulted with her primary care physician. She has not yet consulted a nephrologist but has received a referral. Her diet includes one  dark soda per day and sweet tea. During her ER visit, she received IV fluids, Zofran , Toradol , and sodium chloride . She did not receive any potassium.    Interval history 07/17/2022 Patient verbally consents to the use of voice recording for the purpose of electronically capturing documentation for the patient encounter.   Kimberly Villanueva is a 33 y.o. female who was last seen in 10/2021.   Kimberly Villanueva states  she has not had any seizures in the interim. Her last seizure was in 2012. Her migraines are doing well on the Topamax . She is not sleeping well due to life stressors. She  continues to have issues with concentration. She notes her concentration was improving some towards the end of the year, until her life stressors began recently.  She is getting a couple of hours of sleep per night. She goes to bed at 10:30 PM or 11:00 PM. If she is not sleeping, she lays in bed until 5:00 AM. She is reading in bed.   Kimberly Villanueva's mood has not been great lately. She is on Zoloft . She is looking to establish care with a therapist. Her annual appointment with her primary care provider in 09/2022.   Complete Medication List, Past Medical History, Family History and Social History reviewed as documented in electronic record.  Allergies:  Allergies  Allergen Reactions  . Amoxicillin Other (See Comments)    Shortness of breath, palpitations as infant  . Codeine Palpitations  . Keppra [Levetiracetam] Hallucination  . Lamictal [Lamotrigine] Hallucination  . Sulfamethoxazole -Trimethoprim  Abdominal Pain, Dizziness, Headache, Nausea, Rash and Shortness Of Breath  . Topiramate  Hallucination    Generic only causes side effects    Review of Systems: I performed a complete ROS. Positive and pertinent negative responses are noted in the HPI; all other systems are negative.   Examination: Vitals:   02/13/24 1009  BP: 129/84  Pulse: 109    General: alert, awake, in NAD HEENT: anicteric sclera, moist mucous membranes Extremities: no cyanosis or edema  Neurological Exam:  MS: AA&Ox4, appropriately interactive, normal affect. Speech: Fluent without dysarthria. Repetition and naming intact.   Cranial nerves: Pupils equal round and reactive to light, extraocular movements are intact, visual fields are full.  Sensation is intact in V1-V3 distribution to light touch and pinprick. Face is symmetric without any  weakness. Hearing is intact to voice. Voice normal, uvula midline, palate elevates symmetrically. Strong shoulder shrug bilaterally. Tongue protrudes midline without atrophy or fasciculations.  Motor: Normal bulk and tone throughout. No pronator drift. No tremor, rigidity or bradykinesia. Strength is 5/5 throughout.  Sensory: intact to light touch, pinprick, temperature, vibration and proprioception. No extinction on double simultaneous stimulation. Romberg is absent. Coordination: Finger-nose-finger and heel-to-shin intact without dysmetria or ataxia. Rapid alternating movements intact. Gait: Normal posture, base, station, stride and arm-swing. Tandem gait, walking on heels/toes intact.    Results: Previous Diagnostic Work-up:  MRI: Date: 04/10/17 (personally reviewed) FINDINGS: Diffusely prominent CSF over the cerebral convexities suggests mild generalized cerebral volume loss. The ventricles are normal size and position. No acute hemorrhage, abnormal mass effect, restricted diffusion, or other evidence of acute infarct is identified.  No pathologic intracranial enhancement is seen. No suspicious mass or obvious mesial temporal lobe asymmetry identified.   IMPRESSION: No acute intracranial abnormality identified.   MRI brain 01/26/2006 normal EEG: 03/23/2006 - abnormal, generalized spike and wave discharges; sleep and background was normal; EEG: 01/07/2007 - abnormal, photoparoxysmal response  Review of Systems  Patient Active Problem List  Diagnosis  .  Chronic migraine  . GAD (generalized anxiety disorder)  . Chronic non-seasonal allergic rhinitis  . Intractable epilepsy without status epilepticus (CMS/HHS-HCC)  . Exercise-induced bronchospasm (HHS-HCC)  . Depression  . Cystocele, midline  . Not immune to rubella  . Placental abruption in second trimester (HHS-HCC)  . Nephrolithiasis  . Acute pyelonephritis  . Adjustment disorder with mixed disturbance of emotions and conduct     Outpatient Medications Prior to Visit  Medication Sig Dispense Refill  . albuterol  MDI, PROVENTIL , VENTOLIN , PROAIR , HFA 90 mcg/actuation inhaler Inhale 2 inhalations into the lungs every 4 (four) hours as needed for Wheezing 1 each 0  . drospirenone -ethinyl estradioL  (YASMIN ) 3-0.03 mg tablet Take 1 tablet by mouth once daily 28 tablet 11  . fexofenadine (ALLEGRA) 180 MG tablet Take by mouth    . fluticasone propionate (FLONASE) 50 mcg/actuation nasal spray Place 2 sprays into both nostrils once daily    . fremanezumab-vfrm 225 mg/1.5 mL AtIn Inject 225 mg subcutaneously monthly for 30 days 1.5 mL 0  . lacosamide (VIMPAT) 100 mg tablet Take 1 tablet (100 mg total) by mouth every morning for 90 days 30 tablet 2  . lacosamide (VIMPAT) 150 mg tablet Take 1 tablet (150 mg total) by mouth every evening for 90 days 90 tablet 0  . montelukast  (SINGULAIR ) 10 mg tablet take 1 tablet by mouth everyday at bedtime 100 tablet 3  . [name] , 28, 3-0.02 mg tablet TAKE 1 TABLET BY MOUTH EVERY DAY 28 tablet 11  . sertraline  (ZOLOFT ) 100 MG tablet TAKE 2 TABLETS (200 MG TOTAL) BY MOUTH ONCE DAILY FOR 360 DAYS 200 tablet 3  . ZOLMitriptan  (ZOMIG -ZMT) 5 MG disintegrating tablet TAKE 1 TABLET BY MOUTH AS NEEDED FOR MIGRAINE. 10 tablet 1   No facility-administered medications prior to visit.      Objective  Vitals:   02/13/24 1009  BP: 129/84  Pulse: 109  SpO2: 98%  Weight: 81.2 kg (179 lb)  Height: 165.1 cm (5' 5)  PainSc: 0-No pain   Body mass index is 29.79 kg/m.  Results LABS Lacosamide level: 10 (10/11/2023)  DIAGNOSTIC EKG: Normal (10/11/2023)        Assessment/Plan:   Assessment & Plan Migraine without aura with adverse reaction to Ajovy (fremanezumab) She experiences worsening injection site reactions to Ajovy, including dark red, itchy, and painful areas. Despite these reactions, Ajovy effectively reduces migraine frequency to about six per month. - Discontinue Ajovy due to  adverse reactions. - Initiate Nurtec as a preventive treatment for migraines, to be taken every other day. - Refill Zomig  for acute migraine management. - Await prior authorization for Nurtec and notify her once approved.  Partial symptomatic epilepsy with complex partial seizures, not intractable, without status epilepticus She reports occasional muscle twitches but no episodes of zoning out, confusion, or loss of awareness. She has not had a seizure in thirteen years and is currently on lacosamide 100 mg in the morning and 150 mg in the evening. - Continue lacosamide 100 mg in the morning and 150 mg in the evening. - Monitor for any new episodes of zoning out, confusion, or loss of awareness. - No ambulatory EEG unless new episodes occur.  Myoclonic jerking of unclear etiology She reports occasional muscle twitches without loss of awareness or confusion, not similar to previous seizure episodes.   Diagnoses and all orders for this visit:  Partial symptomatic epilepsy with complex partial seizures, not intractable, without status epilepticus (CMS/HHS-HCC)  Encounter for therapeutic drug monitoring  Intractable migraine  without aura and without status migrainosus -     rimegepant (NURTEC ODT) 75 mg disintegrating tablet; Take 1 tablet (75 mg total) by mouth every other day  Seizure-like activity (CMS/HHS-HCC)  Medication adverse effect, initial encounter  Other orders -     ZOLMitriptan  (ZOMIG -ZMT) 5 MG disintegrating tablet; Take 1 tablet (5 mg total) by mouth as needed for Migraine         This visit was coded based on medical decision making (MDM).           Future Appointments     Date/Time Provider Department Center Visit Type   09/25/2024 9:00 AM (Arrive by 8:45 AM) Trinidad Cassondra Fell, MD [facility] PHYSICAL       There are no Patient Instructions on file for this visit.  An after visit summary was provided for the patient  either in written format (printed) or through [facility].  This note has been created using automated tools and reviewed for accuracy by [facility] WASIM.

## 2024-02-15 ENCOUNTER — Emergency Department
Admission: EM | Admit: 2024-02-15 | Discharge: 2024-02-16 | Disposition: A | Discharge status: Home / Self Care | Attending: Emergency Medicine | Admitting: Emergency Medicine

## 2024-02-15 ENCOUNTER — Other Ambulatory Visit: Payer: Self-pay

## 2024-02-15 DIAGNOSIS — R1032 Left lower quadrant pain: Secondary | ICD-10-CM | POA: Insufficient documentation

## 2024-02-15 DIAGNOSIS — R630 Anorexia: Secondary | ICD-10-CM | POA: Diagnosis not present

## 2024-02-15 DIAGNOSIS — R109 Unspecified abdominal pain: Secondary | ICD-10-CM | POA: Diagnosis present

## 2024-02-15 DIAGNOSIS — R112 Nausea with vomiting, unspecified: Secondary | ICD-10-CM | POA: Diagnosis not present

## 2024-02-15 LAB — CBC WITH DIFFERENTIAL/PLATELET
Abs Immature Granulocytes: 0.04 K/uL (ref 0.00–0.07)
Basophils Absolute: 0.1 K/uL (ref 0.0–0.1)
Basophils Relative: 1 %
Eosinophils Absolute: 0.6 K/uL — ABNORMAL HIGH (ref 0.0–0.5)
Eosinophils Relative: 4 %
HCT: 34.7 % — ABNORMAL LOW (ref 36.0–46.0)
Hemoglobin: 11.8 g/dL — ABNORMAL LOW (ref 12.0–15.0)
Immature Granulocytes: 0 %
Lymphocytes Relative: 30 %
Lymphs Abs: 4 K/uL (ref 0.7–4.0)
MCH: 30.3 pg (ref 26.0–34.0)
MCHC: 34 g/dL (ref 30.0–36.0)
MCV: 89 fL (ref 80.0–100.0)
Monocytes Absolute: 1.3 K/uL — ABNORMAL HIGH (ref 0.1–1.0)
Monocytes Relative: 10 %
Neutro Abs: 7.1 K/uL (ref 1.7–7.7)
Neutrophils Relative %: 55 %
Platelets: 320 K/uL (ref 150–400)
RBC: 3.9 MIL/uL (ref 3.87–5.11)
RDW: 12.4 % (ref 11.5–15.5)
WBC: 13.1 K/uL — ABNORMAL HIGH (ref 4.0–10.5)
nRBC: 0 % (ref 0.0–0.2)

## 2024-02-15 LAB — URINALYSIS, ROUTINE W REFLEX MICROSCOPIC
Bilirubin Urine: NEGATIVE
Glucose, UA: NEGATIVE mg/dL
Hgb urine dipstick: NEGATIVE
Ketones, ur: NEGATIVE mg/dL
Leukocytes,Ua: NEGATIVE
Nitrite: NEGATIVE
Protein, ur: NEGATIVE mg/dL
Specific Gravity, Urine: 1.008 (ref 1.005–1.030)
pH: 6 (ref 5.0–8.0)

## 2024-02-15 LAB — COMPREHENSIVE METABOLIC PANEL WITH GFR
ALT: 19 U/L (ref 0–44)
AST: 19 U/L (ref 15–41)
Albumin: 3.7 g/dL (ref 3.5–5.0)
Alkaline Phosphatase: 67 U/L (ref 38–126)
Anion gap: 12 (ref 5–15)
BUN: 19 mg/dL (ref 6–20)
CO2: 22 mmol/L (ref 22–32)
Calcium: 8.8 mg/dL — ABNORMAL LOW (ref 8.9–10.3)
Chloride: 102 mmol/L (ref 98–111)
Creatinine, Ser: 0.78 mg/dL (ref 0.44–1.00)
GFR, Estimated: >60 mL/min (ref >60)
Glucose, Bld: 92 mg/dL (ref 70–99)
Potassium: 3.5 mmol/L (ref 3.5–5.1)
Sodium: 136 mmol/L (ref 135–145)
Total Bilirubin: 0.6 mg/dL (ref 0.0–1.2)
Total Protein: 6.7 g/dL (ref 6.5–8.1)

## 2024-02-15 LAB — POC URINE PREG, ED: Preg Test, Ur: NEGATIVE

## 2024-02-15 NOTE — ED Triage Notes (Addendum)
 Having intermittent Left sided abdominal pain since Wednesday. Pain started to intensify and radiated through to the back. Went to Urgent care and workup was negative. She does have a history of fibroids. Endorses Nausea, Denies chest pain.

## 2024-02-16 ENCOUNTER — Emergency Department

## 2024-02-16 DIAGNOSIS — R1032 Left lower quadrant pain: Secondary | ICD-10-CM | POA: Diagnosis not present

## 2024-02-16 LAB — LIPASE, BLOOD: Lipase: 61 U/L — ABNORMAL HIGH (ref 11–51)

## 2024-02-16 MED ORDER — MORPHINE SULFATE (PF) 4 MG/ML IV SOLN
4.0000 mg | Freq: Once | INTRAVENOUS | Status: AC
  Administered 2024-02-16: 4 mg via INTRAVENOUS
  Filled 2024-02-16: qty 1

## 2024-02-16 MED ORDER — ONDANSETRON HCL 4 MG/2ML IJ SOLN
4.0000 mg | INTRAMUSCULAR | Status: AC
  Administered 2024-02-16: 4 mg via INTRAVENOUS
  Filled 2024-02-16: qty 2

## 2024-02-16 MED ORDER — ONDANSETRON 4 MG PO TBDP: ORAL_TABLET | ORAL | 0 refills | Status: AC

## 2024-02-16 MED ORDER — IOHEXOL 300 MG/ML  SOLN
100.0000 mL | Freq: Once | INTRAMUSCULAR | Status: AC | PRN
  Administered 2024-02-16: 100 mL via INTRAVENOUS

## 2024-02-16 MED ORDER — KETOROLAC TROMETHAMINE 30 MG/ML IJ SOLN
15.0000 mg | Freq: Once | INTRAMUSCULAR | Status: AC
  Administered 2024-02-16: 15 mg via INTRAVENOUS
  Filled 2024-02-16: qty 1

## 2024-02-16 NOTE — ED Notes (Signed)
 Pt given DC instructions. Pt verbalized understanding of medications and follow up care. Pt ambulatory from ED without difficulty. NAD noted at time of departure.

## 2024-02-16 NOTE — Discharge Instructions (Signed)

## 2024-02-16 NOTE — ED Provider Notes (Signed)
 [facility] Provider Note    Event Date/Time   First MD Initiated Contact with Patient 02/15/24 2328     (approximate)   History   Abdominal Pain   HPI Kimberly Villanueva is a 33 y.o. female who presents for evaluation of several days of left-sided abdominal pain.  She said that for the first couple of days it came and went, but today it has been essentially constant.  It is in her left lower quadrant and occasionally radiates to the left side of her back.  Today it has been accompanied with some nausea and vomiting and decreased appetite.  No bowel habit changes.  She has had kidney stones in the past as well as knowing that she has uterine fibroids and has had pain from ovarian cyst previously.  The pain is not doubling her over but can be intense at times.     Physical Exam   Triage Vital Signs: ED Triage Vitals  Encounter Vitals Group     BP 02/15/24 2153 (!) 123/99     Girls Systolic BP Percentile --      Girls Diastolic BP Percentile --      Boys Systolic BP Percentile --      Boys Diastolic BP Percentile --      Pulse Rate 02/15/24 2153 98     Resp 02/15/24 2153 18     Temp 02/15/24 2156 98.7 F (37.1 C)     Temp Source 02/15/24 2156 Oral     SpO2 02/15/24 2153 96 %     Weight --      Height --      Head Circumference --      Peak Flow --      Pain Score 02/15/24 2151 5     Pain Loc --      Pain Education --      Exclude from Growth Chart --     Most recent vital signs: Vitals:   02/15/24 2153 02/15/24 2156  BP: (!) 123/99   Pulse: 98   Resp: 18   Temp:  98.7 F (37.1 C)  SpO2: 96%     General: Awake, alert, conversant. CV:  Good peripheral perfusion.  Regular rate and rhythm. Resp:  Normal effort. Speaking easily and comfortably, no accessory muscle usage nor intercostal retractions.   Abd:  Soft with no distention.  Tender to palpation with guarding in the left lower quadrant, no palpable masses or deformities.  No left-sided  flank tenderness to percussion.     ED Results / Procedures / Treatments   Labs (all labs ordered are listed, but only abnormal results are displayed) Labs Reviewed  CBC WITH DIFFERENTIAL/PLATELET - Abnormal; Notable for the following components:      Result Value   WBC 13.1 (*)    Hemoglobin 11.8 (*)    HCT 34.7 (*)    Monocytes Absolute 1.3 (*)    Eosinophils Absolute 0.6 (*)    All other components within normal limits  COMPREHENSIVE METABOLIC PANEL WITH GFR - Abnormal; Notable for the following components:   Calcium 8.8 (*)    All other components within normal limits  URINALYSIS, ROUTINE W REFLEX MICROSCOPIC - Abnormal; Notable for the following components:   Color, Urine STRAW (*)    APPearance CLEAR (*)    All other components within normal limits  LIPASE, BLOOD - Abnormal; Notable for the following components:   Lipase 61 (*)    All other components within  normal limits  POC URINE PREG, ED     RADIOLOGY See ED course for details   PROCEDURES:  Critical Care performed: No  Procedures    IMPRESSION / MDM / ASSESSMENT AND PLAN / ED COURSE  I reviewed the triage vital signs and the nursing notes.                              Differential diagnosis includes, but is not limited to, diverticulitis, renal/ureteral colic, UTI/pyelonephritis, STD/PID/TOA, pain from ovarian cyst or fibroids, pancreatitis, biliary colic.  Patient's presentation is most consistent with acute presentation with potential threat to life or bodily function.  Labs/studies ordered: CBC with differential, CMP, urinalysis, lipase, urine pregnancy test, CT abdomen/pelvis  Interventions/Medications given:  Medications  morphine  (PF) 4 MG/ML injection 4 mg (4 mg Intravenous Given 02/16/24 0016)  ondansetron  (ZOFRAN ) injection 4 mg (4 mg Intravenous Given 02/16/24 0016)  ketorolac  (TORADOL ) 30 MG/ML injection 15 mg (15 mg Intravenous Given 02/16/24 0016)  iohexol  (OMNIPAQUE ) 300 MG/ML solution  100 mL (100 mLs Intravenous Contrast Given 02/16/24 0026)    (Note:  hospital course my include additional interventions and/or labs/studies not listed above.)   Vital signs stable, slightly elevated heart rate but not greater than 100.  Patient has a mild leukocytosis of 13 but a clear urine.  CMP is normal with no LFT elevation but she has a mildly elevated lipase of 61 but this may be due to her decreased intake and vomiting rather than representing acute pancreatitis.  Of note, she has no tenderness to palpation of the epigastrium nor right upper quadrant, but does have tenderness to the left lower quadrant.  I discussed evaluation options with the patient and we are in agreement that we will proceed with a CT of the abdomen and pelvis.  I ordered morphine  4 mg IV, Zofran  4 mg IV, and Toradol  15 mg IV for pain and nausea control.  No clear indication for IV fluids at this time.   Clinical Course as of 02/16/24 0144  Sat Feb 16, 2024  0140 CT ABDOMEN PELVIS W CONTRAST I independently viewed and interpreted the patient's abd/pelvis CT, as well as reviewing the radiologist's report.  I see no evidence of large ovarian cyst nor diverticulitis nor ureteral stone.  Radiologist confirmed no acute findings, but patient does have some left renal stones. [CF]  0140 Reassessed patient.  She feels much better.  Discussed reassuring findings.  Patient is comfortable with the plan for d/c home .  Gave usual/customary return precautions.    The patient's medical screening exam is reassuring with no indication of an emergent medical condition requiring hospitalization or additional evaluation at this point.  The patient is safe and appropriate for discharge and outpatient follow up. [CF]    Clinical Course User Index [CF] Gordan Huxley, MD     FINAL CLINICAL IMPRESSION(S) / ED DIAGNOSES   Final diagnoses:  Left lower quadrant abdominal pain     Rx / DC Orders   ED Discharge Orders     None         Note:  This document was prepared using Dragon voice recognition software and may include unintentional dictation errors.   Gordan Huxley, MD 02/16/24 [phone]

## 2024-05-28 NOTE — Progress Notes (Signed)
 Chief Complaint  Patient presents with   Urinary Symptoms    X several days    Abdominal Pain    Subjective History of Present Illness Kimberly Villanueva is a 33 year old female with a history of urinary tract infections and kidney stones who presents with urinary symptoms.  UTI symptoms Abdominal pain She has been experiencing urinary symptoms for a couple of days, including the presence of particulate matter in her urine, pain and cramping in her lower abdomen, and a post-urination sensation that is not quite burning. Home urine tests showed positive leukocytes but negative nitrites on two occasions.  She has a history of a urinary tract infection in January that progressed to sepsis, requiring a three-day hospital stay. During that episode, she was treated with cephalexin  for a resistant E. coli infection. She is concerned about the possibility of another infection given her past experience.  She also has a history of kidney stones, with a previous stone measuring approximately five millimeters that later appeared smaller. She believes she may have passed a couple of smaller stones.   She reports increased bowel movements over the past few days, though not diarrhea, and experienced mild nausea this morning. She has a known allergy to sulfa , amoxicillin, and Bactrim , and is currently taking lacosamide (Vimpat) and glucosamine. She previously took Topamax .  Review of Systems The pertinent positive and negative findings are as noted in the HPI.   Patient Active Problem List  Diagnosis   Chronic migraine   GAD (generalized anxiety disorder)   Chronic non-seasonal allergic rhinitis   Intractable epilepsy without status epilepticus (CMS/HHS-HCC)   Exercise-induced bronchospasm (HHS-HCC)   Depression   Cystocele, midline   Not immune to rubella   Placental abruption in second trimester (HHS-HCC)   Nephrolithiasis   Acute pyelonephritis   Adjustment disorder with mixed  disturbance of emotions and conduct    Outpatient Medications Prior to Visit  Medication Sig Dispense Refill   albuterol  MDI, PROVENTIL , VENTOLIN , PROAIR , HFA 90 mcg/actuation inhaler Inhale 2 inhalations into the lungs every 4 (four) hours as needed for Wheezing 1 each 0   drospirenone -ethinyl estradioL  (YASMIN ) 3-0.03 mg tablet Take 1 tablet by mouth once daily 28 tablet 11   fexofenadine (ALLEGRA) 180 MG tablet Take by mouth     fluticasone propionate (FLONASE) 50 mcg/actuation nasal spray Place 2 sprays into both nostrils once daily     folic acid (FOLVITE) 1 MG tablet Take 1 mg by mouth once daily     lacoSAMide (VIMPAT) 100 mg tablet Take 1 tablet (100 mg total) by mouth every morning for 90 days 30 tablet 2   lacoSAMide (VIMPAT) 150 mg tablet Take 1 tablet (150 mg total) by mouth every evening for 90 days 90 tablet 0   LORazepam  (ATIVAN ) 0.5 MG tablet Take 0.5 mg by mouth as needed for Anxiety     montelukast  (SINGULAIR ) 10 mg tablet Take 1 tablet (10 mg total) by mouth at bedtime 100 tablet 3   rimegepant (NURTEC ODT) 75 mg disintegrating tablet Take 1 tablet (75 mg total) by mouth every other day 15 tablet 11   sertraline  (ZOLOFT ) 100 MG tablet Take 2 tablets (200 mg total) by mouth once daily for 360 days 200 tablet 2   ZOLMitriptan  (ZOMIG -ZMT) 5 MG disintegrating tablet TAKE 1 TABLET BY MOUTH AS NEEDED FOR MIGRAINE. 10 tablet 2   midazolam (NAYZILAM NASAL) Place into one nostril     Kimberly Villanueva , 27, 3-0.02 mg tablet TAKE 1 TABLET BY  MOUTH EVERY DAY (Patient not taking: Reported on 02/27/2024) 28 tablet 11   No facility-administered medications prior to visit.      Objective  Vitals:   05/28/24 1508 05/28/24 1509  BP:  125/84  Pulse:  109  Temp:  36.8 C (98.2 F)  TempSrc:  Oral  Weight: 83 kg (183 lb)   PainSc:   3   3  PainLoc: Back    Body mass index is 30.45 kg/m.  Home Vitals:     Physical Exam Constitutional:      Appearance: Normal appearance.   Pulmonary:     Effort: Pulmonary effort is normal.  Abdominal:     General: Abdomen is flat.     Palpations: Abdomen is soft.     Tenderness: There is abdominal tenderness (lower abdomen). There is no right CVA tenderness, left CVA tenderness, guarding or rebound.  Skin:    General: Skin is warm.  Neurological:     Mental Status: She is alert and oriented to person, place, and time.  Psychiatric:        Mood and Affect: Mood normal.        Behavior: Behavior normal.    Physical Exam   Results      Assessment/Plan:   Assessment & Plan Acute cystitis with hematuria Nephrolithiasis Urinary symptoms with hematuria, no leukocytes or nitrites in POC UA. Differential includes kidney stone-related UTI. Patient had UTI-sepsis in January 2025 with E.coli showing resistant to several abx. Cephalosporin was not resistant due to that report. Will start antibiotics today.  - Sent urine for culture and sensitivity. - Prescribed cefpodoxime  twice daily for 7 days. - Advised to go to ED if fever/chills, nausea/vomiting develop. - Advised to maintain adequate hydration.  Diagnoses and all orders for this visit:  Acute cystitis with hematuria -     Urinalysis Chemical with Microscopic Review -     Culture, Urine, Routine with Pyuria Screen (reflexed from Urinalysis) -     cefpodoxime  (VANTIN ) 200 MG tablet; Take 1 tablet (200 mg total) by mouth 2 (two) times daily for 7 days  Urinary tract infection symptoms -     [facility] POC Urinalysis Chemical w/Option to Reflex Urine Culture; Future  This plan was discussed with the patient and questions were answered. Follow up as indicated, or sooner should any new problems arise, if conditions worsen, or if they are otherwise concerned.   See patient instructions for additional information.   Nao Souma, MD PhD    [facility]   This visit was coded based on medical decision making (MDM).           Future Appointments      Date/Time Provider Department Center Visit Type   05/29/2024 3:30 PM (Arrive by 3:15 PM) Trinidad Cassondra Fell, MD [facility] OFFICE VISIT   07/16/2024 3:00 PM (Arrive by 2:45 PM) Darcus Panning, MD [facility] VIDEO VISIT RETURN   09/25/2024 9:00 AM (Arrive by 8:45 AM) Trinidad Cassondra Fell, MD [facility] PHYSICAL       There are no Patient Instructions on file for this visit.  An after visit summary was provided for the patient either in written format (printed) or through [facility].  This note has been created using automated tools and reviewed for accuracy by NAO SOUMA.

## 2024-05-29 ENCOUNTER — Other Ambulatory Visit: Payer: Self-pay

## 2024-05-29 ENCOUNTER — Emergency Department

## 2024-05-29 ENCOUNTER — Encounter: Payer: Self-pay | Admitting: Intensive Care

## 2024-05-29 ENCOUNTER — Emergency Department
Admission: EM | Admit: 2024-05-29 | Discharge: 2024-05-29 | Disposition: A | Discharge status: Home / Self Care | Attending: Emergency Medicine | Admitting: Emergency Medicine

## 2024-05-29 DIAGNOSIS — N390 Urinary tract infection, site not specified: Secondary | ICD-10-CM | POA: Insufficient documentation

## 2024-05-29 DIAGNOSIS — M545 Low back pain, unspecified: Secondary | ICD-10-CM | POA: Diagnosis present

## 2024-05-29 DIAGNOSIS — J45909 Unspecified asthma, uncomplicated: Secondary | ICD-10-CM | POA: Diagnosis not present

## 2024-05-29 LAB — CBC WITH DIFFERENTIAL/PLATELET
Abs Immature Granulocytes: 0.04 K/uL (ref 0.00–0.07)
Basophils Absolute: 0.1 K/uL (ref 0.0–0.1)
Basophils Relative: 0 %
Eosinophils Absolute: 0.6 K/uL — ABNORMAL HIGH (ref 0.0–0.5)
Eosinophils Relative: 5 %
HCT: 41.3 % (ref 36.0–46.0)
Hemoglobin: 14.1 g/dL (ref 12.0–15.0)
Immature Granulocytes: 0 %
Lymphocytes Relative: 27 %
Lymphs Abs: 3 K/uL (ref 0.7–4.0)
MCH: 29.3 pg (ref 26.0–34.0)
MCHC: 34.1 g/dL (ref 30.0–36.0)
MCV: 85.9 fL (ref 80.0–100.0)
Monocytes Absolute: 0.9 K/uL (ref 0.1–1.0)
Monocytes Relative: 8 %
Neutro Abs: 6.6 K/uL (ref 1.7–7.7)
Neutrophils Relative %: 60 %
Platelets: 292 K/uL (ref 150–400)
RBC: 4.81 MIL/uL (ref 3.87–5.11)
RDW: 12.4 % (ref 11.5–15.5)
WBC: 11.2 K/uL — ABNORMAL HIGH (ref 4.0–10.5)
nRBC: 0 % (ref 0.0–0.2)

## 2024-05-29 LAB — URINALYSIS, W/ REFLEX TO CULTURE (INFECTION SUSPECTED)
Bilirubin Urine: NEGATIVE
Glucose, UA: NEGATIVE mg/dL
Hgb urine dipstick: NEGATIVE
Ketones, ur: NEGATIVE mg/dL
Leukocytes,Ua: NEGATIVE
Nitrite: NEGATIVE
Protein, ur: NEGATIVE mg/dL
Specific Gravity, Urine: 1.021 (ref 1.005–1.030)
pH: 5 (ref 5.0–8.0)

## 2024-05-29 LAB — COMPREHENSIVE METABOLIC PANEL WITH GFR
ALT: 12 U/L (ref 0–44)
AST: 20 U/L (ref 15–41)
Albumin: 4.2 g/dL (ref 3.5–5.0)
Alkaline Phosphatase: 99 U/L (ref 38–126)
Anion gap: 13 (ref 5–15)
BUN: 12 mg/dL (ref 6–20)
CO2: 20 mmol/L — ABNORMAL LOW (ref 22–32)
Calcium: 9 mg/dL (ref 8.9–10.3)
Chloride: 105 mmol/L (ref 98–111)
Creatinine, Ser: 0.69 mg/dL (ref 0.44–1.00)
GFR, Estimated: >60 mL/min (ref >60)
Glucose, Bld: 130 mg/dL — ABNORMAL HIGH (ref 70–99)
Potassium: 3.3 mmol/L — ABNORMAL LOW (ref 3.5–5.1)
Sodium: 138 mmol/L (ref 135–145)
Total Bilirubin: <0.2 mg/dL (ref 0.0–1.2)
Total Protein: 6.8 g/dL (ref 6.5–8.1)

## 2024-05-29 LAB — LACTIC ACID, PLASMA
Lactic Acid, Venous: 2.4 mmol/L (ref 0.5–1.9)
Lactic Acid, Venous: 1 mmol/L (ref 0.5–1.9)

## 2024-05-29 LAB — POC URINE PREG, ED: Preg Test, Ur: NEGATIVE

## 2024-05-29 MED ORDER — IOHEXOL 300 MG/ML  SOLN
100.0000 mL | Freq: Once | INTRAMUSCULAR | Status: AC | PRN
  Administered 2024-05-29: 100 mL via INTRAVENOUS

## 2024-05-29 MED ORDER — SODIUM CHLORIDE 0.9 % IV SOLN
1.0000 g | Freq: Once | INTRAVENOUS | Status: AC
  Administered 2024-05-29: 1 g via INTRAVENOUS
  Filled 2024-05-29: qty 10

## 2024-05-29 MED ORDER — PANTOPRAZOLE SODIUM 40 MG PO TBEC: 40.0000 mg | DELAYED_RELEASE_TABLET | Freq: Every day | ORAL | 0 refills | Status: AC

## 2024-05-29 MED ORDER — MORPHINE SULFATE (PF) 4 MG/ML IV SOLN
4.0000 mg | Freq: Once | INTRAVENOUS | Status: AC
  Administered 2024-05-29: 4 mg via INTRAVENOUS
  Filled 2024-05-29: qty 1

## 2024-05-29 MED ORDER — LACTATED RINGERS IV BOLUS
1000.0000 mL | Freq: Once | INTRAVENOUS | Status: AC
  Administered 2024-05-29: 1000 mL via INTRAVENOUS

## 2024-05-29 MED ORDER — ONDANSETRON HCL 4 MG/2ML IJ SOLN
4.0000 mg | Freq: Once | INTRAMUSCULAR | Status: AC
  Administered 2024-05-29: 4 mg via INTRAVENOUS
  Filled 2024-05-29: qty 2

## 2024-05-29 NOTE — ED Notes (Addendum)
 [name]

## 2024-05-29 NOTE — ED Notes (Signed)
 Pt up to the restroom. Stated that her hand was red. NT communicated to RN.

## 2024-05-29 NOTE — ED Provider Notes (Signed)
 [facility] Provider Note   Event Date/Time   First MD Initiated Contact with Patient 05/29/24 1522     (approximate) History  Back Pain  HPI Kimberly Villanueva is a 33 y.o. female with a stated past medical history of migraines, hypokalemia, asthma who presents complaining of of right sided flank pain, right lower quadrant pain, and dysuria that has been present over the last 3 days and worsening since onset.  Patient also states she has noticed floaters in her urine.  Patient states that she has a history of kidney stones and has been in urosepsis in the beginning of this year.  Patient states that she had a UA yesterday that was only showing leukocytes and microscopic hematuria.  Patient was started on cefpodoxime  however a urine culture was not sent.  Patient has had 2 doses of the cefpodoxime  with no improvement in her symptoms.  Patient states that since this morning she has also had intermittent chest pressure with associated shortness of breath.  Patient denies this being similar to previous anxiety attacks she has had however patient admits that she is concerned that she may be septic again. ROS: Patient currently denies any vision changes, tinnitus, difficulty speaking, facial droop, sore throat, chest pain, shortness of breath, nausea/vomiting/diarrhea, dysuria, or weakness/numbness/paresthesias in any extremity   Physical Exam  Triage Vital Signs: ED Triage Vitals  Encounter Vitals Group     BP 05/29/24 1324 (!) 134/97     Girls Systolic BP Percentile --      Girls Diastolic BP Percentile --      Boys Systolic BP Percentile --      Boys Diastolic BP Percentile --      Pulse Rate 05/29/24 1324 (!) 112     Resp 05/29/24 1324 18     Temp 05/29/24 1324 98.3 F (36.8 C)     Temp Source 05/29/24 1324 Oral     SpO2 05/29/24 1324 97 %     Weight 05/29/24 1320 183 lb (83 kg)     Height 05/29/24 1320 5' 5 (1.651 m)     Head Circumference --      Peak Flow  --      Pain Score 05/29/24 1320 4     Pain Loc --      Pain Education --      Exclude from Growth Chart --    Most recent vital signs: Vitals:   05/29/24 1715 05/29/24 1921  BP:  116/80  Pulse:  96  Resp:  14  Temp: 98.6 F (37 C) 98.7 F (37.1 C)  SpO2:  100%   General: Awake, oriented x4. CV:  Good peripheral perfusion. Resp:  Normal effort. Abd:  No distention.  Mild right lower quadrant tenderness to palpation Other:  Middle-aged obese Caucasian female resting in stretcher in mild emotional distress and intermittently tearful ED Results / Procedures / Treatments  Labs (all labs ordered are listed, but only abnormal results are displayed) Labs Reviewed  LACTIC ACID, PLASMA - Abnormal; Notable for the following components:      Result Value   Lactic Acid, Venous 2.4 (*)    All other components within normal limits  COMPREHENSIVE METABOLIC PANEL WITH GFR - Abnormal; Notable for the following components:   Potassium 3.3 (*)    CO2 20 (*)    Glucose, Bld 130 (*)    All other components within normal limits  CBC WITH DIFFERENTIAL/PLATELET - Abnormal; Notable for the following components:  WBC 11.2 (*)    Eosinophils Absolute 0.6 (*)    All other components within normal limits  URINALYSIS, W/ REFLEX TO CULTURE (INFECTION SUSPECTED) - Abnormal; Notable for the following components:   Color, Urine YELLOW (*)    APPearance HAZY (*)    Bacteria, UA MANY (*)    All other components within normal limits  LACTIC ACID, PLASMA  POC URINE PREG, ED   EKG ED ECG REPORT I, Artist MARLA Kerns, the attending physician, personally viewed and interpreted this ECG. Date: 05/29/2024 EKG Time: 1608 Rate: 97 Rhythm: normal sinus rhythm QRS Axis: normal Intervals: normal ST/T Wave abnormalities: normal Narrative Interpretation: no evidence of acute ischemia RADIOLOGY ED MD interpretation: 2 view chest x-ray interpreted by me shows no evidence of acute abnormalities including no  pneumonia, pneumothorax, or widened mediastinum  CT of the abdomen and pelvis with IV contrast shows no acute intra-abdominal or pelvic pathology, normal appendix.  There is nonobstructing left renal calculi with no hydronephrosis - All radiology independently interpreted and agree with radiology assessment Official radiology report(s): CT ABDOMEN PELVIS W CONTRAST Result Date: 05/29/2024 CLINICAL DATA:  Right lower quadrant abdominal pain. EXAM: CT ABDOMEN AND PELVIS WITH CONTRAST TECHNIQUE: Multidetector CT imaging of the abdomen and pelvis was performed using the standard protocol following bolus administration of intravenous contrast. RADIATION DOSE REDUCTION: This exam was performed according to the departmental dose-optimization program which includes automated exposure control, adjustment of the mA and/or kV according to patient size and/or use of iterative reconstruction technique. CONTRAST:  OMNIPAQUE  IOHEXOL  300 MG/ML  SOLN COMPARISON:  CT abdomen pelvis dated 02/16/2024. FINDINGS: Lower chest: The visualized lung bases are clear. No intra-abdominal free air or free fluid. Hepatobiliary: No focal liver abnormality is seen. No gallstones, gallbladder wall thickening, or biliary dilatation. Pancreas: Unremarkable. No pancreatic ductal dilatation or surrounding inflammatory changes. Spleen: Normal in size without focal abnormality. Adrenals/Urinary Tract: There is a 5 mm nonobstructing left renal inferior pole calculus and additional punctate nonobstructing left renal calculi. No hydronephrosis. The right kidney is unremarkable. The visualized ureters and urinary bladder probable Stomach/Bowel: There is no bowel obstruction or active inflammation. The appendix is normal. Vascular/Lymphatic: The abdominal aorta and IVC are unremarkable. No portal venous gas. There is no adenopathy. Reproductive: The uterus is anteverted. No suspicious adnexal masses. Other: None Musculoskeletal: No acute or  significant osseous findings. IMPRESSION: 1. No acute intra-abdominal or pelvic pathology. Normal appendix. 2. Nonobstructing left renal calculi. No hydronephrosis. Electronically Signed   By: Vanetta Chou M.D.   On: 05/29/2024 17:21   DG Chest 2 View Result Date: 05/29/2024 CLINICAL DATA:  Shortness of breath and chills EXAM: CHEST - 2 VIEW COMPARISON:  Chest radiograph dated 08/20/2021 FINDINGS: Normal lung volumes. No focal consolidations. No pleural effusion or pneumothorax. The heart size and mediastinal contours are within normal limits. No acute osseous abnormality. IMPRESSION: No active cardiopulmonary disease. Electronically Signed   By: Limin  Xu M.D.   On: 05/29/2024 14:33   PROCEDURES: Critical Care performed: No Procedures MEDICATIONS ORDERED IN ED: Medications  cefTRIAXone  (ROCEPHIN ) 1 g in sodium chloride  0.9 % 100 mL IVPB (0 g Intravenous Stopped 05/29/24 1645)  lactated ringers bolus 1,000 mL (0 mLs Intravenous Stopped 05/29/24 1807)  iohexol  (OMNIPAQUE ) 300 MG/ML solution 100 mL (100 mLs Intravenous Contrast Given 05/29/24 1652)  morphine  (PF) 4 MG/ML injection 4 mg (4 mg Intravenous Given 05/29/24 1713)  ondansetron  (ZOFRAN ) injection 4 mg (4 mg Intravenous Given 05/29/24 1713)   IMPRESSION /  MDM / ASSESSMENT AND PLAN / ED COURSE  I reviewed the triage vital signs and the nursing notes.                             The patient is on the cardiac monitor to evaluate for evidence of arrhythmia and/or significant heart rate changes. Patient's presentation is most consistent with acute presentation with potential threat to life or bodily function. Patient is a 33 year old female with the above-stated past medical history who presents complaining of right sided flank and lower abdominal pain in the setting of dysuria and a history of urosepsis. DDx: Urosepsis, UTI, kidney stone, appendicitis Plan: CBC, CMP, lactic acid, UA, EKG, chest x-ray, CT abdomen and pelvis with IV  contrast  Laboratory and radiologic evaluation shows evidence of uncomplicated urinary tract infection.  Patient's pain well-controlled and will treat empirically with Rocephin .  Patient is on cefpodoxime  in the outpatient setting and is appropriate given previous urine culture that showed E. coli sensitive to third-generation cephalosporins.  Patient encouraged to continue taking this medication as well as follow-up with urology as needed for her kidney stones.  Patient expressed understanding and is agreement to discharge today with outpatient PCP and urology follow-up.  Patient given strict return precautions and all questions answered prior to discharge  Dispo: Discharge home with PCP and urology follow-up   FINAL CLINICAL IMPRESSION(S) / ED DIAGNOSES   Final diagnoses:  Acute right-sided low back pain without sciatica  Urinary tract infection with hematuria, site unspecified   Rx / DC Orders   ED Discharge Orders          Ordered    pantoprazole (PROTONIX) 40 MG tablet  Daily        05/29/24 1946           Note:  This document was prepared using Dragon voice recognition software and may include unintentional dictation errors.   Olufemi Mofield K, MD 05/29/24 [phone]

## 2024-05-29 NOTE — ED Triage Notes (Signed)
 Patient c/o lower back pain and burning after urination. Also having nausea. Reports noticing floaters in urine. Symptoms started a few days ago  History kidney stones.

## 2024-05-29 NOTE — ED Notes (Signed)
 Patient brought redness around knuckles to RN's attention. Patient feels like her arms and hands are swollen. No pitting edema noted at this point +0 to both hands. Lube was given to partner at bedside to remove ring.

## 2024-06-04 ENCOUNTER — Ambulatory Visit: Admitting: Urology

## 2024-06-04 VITALS — BP 120/81 | HR 99 | Ht 65.0 in | Wt 183.0 lb

## 2024-06-04 DIAGNOSIS — N200 Calculus of kidney: Secondary | ICD-10-CM

## 2024-06-04 DIAGNOSIS — Z87440 Personal history of urinary (tract) infections: Secondary | ICD-10-CM

## 2024-06-04 DIAGNOSIS — N390 Urinary tract infection, site not specified: Secondary | ICD-10-CM

## 2024-06-04 NOTE — Progress Notes (Signed)
° °  06/04/2024 8:52 AM   Kimberly Villanueva 1990/12/02 [id number]  Reason for visit: Follow up back pain, recurrent UTIs, nephrolithiasis  History: Previously seen October 2024 and January 2025 for nonobstructing 5 mm left lower pole stone that has been stable over the last few years History of E. coli UTIs  Physical Exam: LMP 05/20/2024 (Exact Date)   Imaging/labs: I personally viewed and interpreted the most recent CT from December 2025 showing no hydronephrosis, decompressed bladder without lesions, and stable 5 mm left lower pole stone, nonobstructive  Today: Recently seen in the ER December 2025 with low back pain urinalysis relatively benign and culture negative, however reportedly was on antibiotics from an outside provider at that time.  She did not have any significant urinary symptoms.  She denies any left-sided flank pain.  Plan:   Recurrent UTIs: Recommended starting cranberry tablets twice daily to help with UTI prevention, discussed possible other etiologies including chronic stone and treatment options like ureteroscopy or shockwave lithotripsy. Nephrolithiasis: Stable 5 mm nonobstructing left lower pole stone, unclear if this is contributing to her UTIs.  Could consider left ureteroscopy, laser lithotripsy, stent placement in 2026 if recurrent infections despite cranberry tablet prophylaxis RTC 3 months symptom check   Kimberly JAYSON Burnet, MD  [facility] [address] [place], [place] [address] [phone]

## 2024-06-04 NOTE — Patient Instructions (Signed)
 Start cranberry tablets twice daily.  There is good data that these can significantly improve urinary infections for most patients.  If we are not seeing any improvement you continue to get infections could consider ureteroscopy and laser lithotripsy for stone removal in the future.  Laser Therapy for Kidney Stones(ureteroscopy) Laser therapy for kidney stones is a procedure to break up rock-like masses that form inside the kidneys (kidney stones). It is done using a device that beams a strong light (laser) on the kidney stones. This breaks the stones up into small pieces. These small pieces may leave your body when you pee (urinate) or may be taken out during the procedure.  You may need laser therapy if you have kidney stones that are painful or that are stopping you from being able to pee. Tell a health care provider about: Any allergies you have. All medicines you are taking, including vitamins, herbs, eye drops, creams, and over-the-counter medicines. Any problems you or family members have had with anesthesia. Any bleeding problems you have. Any surgeries you have had. Any medical conditions you have. Whether you are pregnant or may be pregnant. What are the risks? Your health care provider will talk with you about risks. These may include: Infection. Bleeding. Allergic reactions to medicines. Damage to: The part of your body that drains pee (urine) from the bladder (urethra). The bladder. The tube that connects the bladder to the kidneys (ureter). Urinary tract infection (UTI). Urethral stricture. This is when the urethra is narrowed by scarring. Trouble peeing. Blockage of the kidney. This may be caused by a piece of kidney stone. What happens before the procedure? When to stop eating and drinking Follow instructions from your provider about what you may eat and drink. These may include: 8 hours before the procedure Stop eating most foods. Do not eat meat, fried foods, or fatty  foods. Eat only light foods, such as toast or crackers. All liquids are okay except energy drinks and alcohol. 6 hours before the procedure Stop eating. Drink only clear liquids, such as water, clear fruit juice, black coffee, plain tea, and sports drinks. Do not drink energy drinks or alcohol. 2 hours before the procedure Stop drinking all liquids. You may be allowed to take medicines with small sips of water. If you do not follow your provider's instructions, your procedure may be delayed or canceled. Medicines Ask your provider about: Changing or stopping your regular medicines. These include any diabetes medicines or blood thinners you take. Taking medicines such as aspirin and ibuprofen. These medicines can thin your blood. Do not take them unless your provider tells you to. Taking over-the-counter medicines, vitamins, herbs, and supplements. Tests You may have a physical exam before the procedure. You may also have tests done. These may include: Imaging tests. Blood or pee tests. Surgery safety Ask your provider: How your surgery site will be marked. What steps will be taken to help prevent infection. These steps may include: Removing hair at the surgery site. Washing skin with a soap that kills germs. Taking antibiotics. General instructions Do not use any products that contain nicotine or tobacco for at least 4 weeks before the procedure. These products include cigarettes, chewing tobacco, and vaping devices, such as e-cigarettes. If you need help quitting, ask your provider. If you will be going home right after the procedure, plan to have a responsible adult: Take you home from the hospital or clinic. You will not be allowed to drive. Care for you for the time  you are told. What happens during the procedure?  An IV will be inserted into one of your veins. You will be given: A sedative. This helps you relax. Anesthesia. This keeps you from feeling pain. It will make you  fall asleep for surgery. A tool with a camera on the end (ureteroscope) will be put into your urethra. It will be moved through your bladder to your kidney. It will send pictures to a screen in the operating room. This will show what parts of your kidney need to be treated. A tube will be put through the ureteroscope. It will be moved into your kidney. The laser device will be put into your kidney through the tube. The laser will be used to break up the kidney stones. A tool with a tiny wire basket may be put through the tube into your kidney. This can help remove the small pieces of the kidney stone. A small mesh tube (stent) may be placed to allow your kidney to drain. The tube and ureteroscope will be taken out at the end of the surgery. The procedure may vary among providers and hospitals. What happens after the procedure? Your blood pressure, heart rate, breathing rate, and blood oxygen level will be monitored until you leave the hospital or clinic. If you had a stent placed, it may have a string that will be secured to your skin. This helps your provider remove the stent. You may be given a strainer to collect any stone pieces that you pass in your pee. Your provider may have these tested. This information is not intended to replace advice given to you by your health care provider. Make sure you discuss any questions you have with your health care provider. Document Revised: 02/03/2022 Document Reviewed: 02/03/2022 Elsevier Patient Education  2024 [employer].

## 2024-09-02 ENCOUNTER — Ambulatory Visit: Admitting: Urology
